# Patient Record
Sex: Female | Born: 1985 | Race: Black or African American | Hispanic: No | Marital: Single | Smoking: Former smoker
Health system: Southern US, Community
[De-identification: ages and names within clinical notes are randomized; demographics above are authoritative.]

## PROBLEM LIST (undated history)

## (undated) ENCOUNTER — Inpatient Hospital Stay (HOSPITAL_COMMUNITY): Payer: Self-pay

## (undated) ENCOUNTER — Emergency Department (HOSPITAL_COMMUNITY): Payer: Medicaid Other

## (undated) DIAGNOSIS — Z789 Other specified health status: Secondary | ICD-10-CM

## (undated) HISTORY — PX: NO PAST SURGERIES: SHX2092

## (undated) HISTORY — DX: Other specified health status: Z78.9

---
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
## 2003-02-18 ENCOUNTER — Emergency Department (HOSPITAL_COMMUNITY): Admission: EM | Admit: 2003-02-18 | Discharge: 2003-02-18 | Payer: Self-pay | Admitting: *Deleted

## 2003-02-18 ENCOUNTER — Encounter: Payer: Self-pay | Admitting: *Deleted

## 2006-08-22 ENCOUNTER — Emergency Department (HOSPITAL_COMMUNITY): Admission: EM | Admit: 2006-08-22 | Discharge: 2006-08-22 | Payer: Self-pay | Admitting: Emergency Medicine

## 2007-02-09 ENCOUNTER — Emergency Department (HOSPITAL_COMMUNITY): Admission: EM | Admit: 2007-02-09 | Discharge: 2007-02-09 | Payer: Self-pay | Admitting: Emergency Medicine

## 2007-07-20 ENCOUNTER — Emergency Department (HOSPITAL_COMMUNITY): Admission: EM | Admit: 2007-07-20 | Discharge: 2007-07-20 | Payer: Self-pay | Admitting: Emergency Medicine

## 2010-01-22 ENCOUNTER — Emergency Department (HOSPITAL_COMMUNITY): Admission: EM | Admit: 2010-01-22 | Discharge: 2010-01-23 | Payer: Self-pay | Admitting: Emergency Medicine

## 2010-12-25 LAB — POCT I-STAT, CHEM 8
Chloride: 102 mEq/L (ref 96–112)
Glucose, Bld: 94 mg/dL (ref 70–99)
HCT: 44 % (ref 36.0–46.0)
Potassium: 3.8 mEq/L (ref 3.5–5.1)

## 2010-12-25 LAB — POCT PREGNANCY, URINE

## 2010-12-25 LAB — RAPID STREP SCREEN (MED CTR MEBANE ONLY): Streptococcus, Group A Screen (Direct): NEGATIVE

## 2013-10-07 NOTE — L&D Delivery Note (Signed)
Patient is 28 y.o. [id number]  admitted in active labor dilated to 10 with, GBS+ inadequate ppx 2/2 precipitous delivery, hx of THC+ on UDS current pregnancy   Delivery Note At 12:53 AM a viable female was delivered via Vaginal, Spontaneous Delivery (Presentation: ROA  ).  APGAR: 9, 9; weight  pending Placenta status: Intact, Spontaneous.  Cord: 3 vessels with the following complications: none  Anesthesia: None  Episiotomy: None Lacerations: 1st degree, a deep sulcal, right labial Suture Repair: 3.0 vicryl rapide Est. Blood Loss (mL): 250  Mom to postpartum.  Baby to Couplet care / Skin to Skin.  Ramah Langhans ROCIO 09/03/2014, 1:38 AM

## 2014-01-05 ENCOUNTER — Encounter (HOSPITAL_COMMUNITY): Payer: Self-pay

## 2014-01-05 ENCOUNTER — Inpatient Hospital Stay (HOSPITAL_COMMUNITY)
Admission: AD | Admit: 2014-01-05 | Discharge: 2014-01-05 | Disposition: A | Discharge status: Home / Self Care | Payer: Medicaid Other | Source: Ambulatory Visit | Attending: Obstetrics & Gynecology | Admitting: Obstetrics & Gynecology

## 2014-01-05 ENCOUNTER — Inpatient Hospital Stay (HOSPITAL_COMMUNITY): Payer: Medicaid Other

## 2014-01-05 DIAGNOSIS — N898 Other specified noninflammatory disorders of vagina: Secondary | ICD-10-CM | POA: Insufficient documentation

## 2014-01-05 DIAGNOSIS — N760 Acute vaginitis: Secondary | ICD-10-CM | POA: Diagnosis not present

## 2014-01-05 DIAGNOSIS — A499 Bacterial infection, unspecified: Secondary | ICD-10-CM | POA: Diagnosis not present

## 2014-01-05 DIAGNOSIS — F17200 Nicotine dependence, unspecified, uncomplicated: Secondary | ICD-10-CM | POA: Insufficient documentation

## 2014-01-05 DIAGNOSIS — O200 Threatened abortion: Secondary | ICD-10-CM

## 2014-01-05 DIAGNOSIS — R110 Nausea: Secondary | ICD-10-CM | POA: Insufficient documentation

## 2014-01-05 DIAGNOSIS — Z3201 Encounter for pregnancy test, result positive: Secondary | ICD-10-CM | POA: Insufficient documentation

## 2014-01-05 DIAGNOSIS — B9689 Other specified bacterial agents as the cause of diseases classified elsewhere: Secondary | ICD-10-CM | POA: Insufficient documentation

## 2014-01-05 LAB — CBC
HEMATOCRIT: 37.6 % (ref 36.0–46.0)
HEMOGLOBIN: 13.4 g/dL (ref 12.0–15.0)
MCH: 35.2 pg — ABNORMAL HIGH (ref 26.0–34.0)
MCHC: 35.6 g/dL (ref 30.0–36.0)
MCV: 98.7 fL (ref 78.0–100.0)
Platelets: 341 10*3/uL (ref 150–400)
RBC: 3.81 MIL/uL — ABNORMAL LOW (ref 3.87–5.11)
RDW: 12.3 % (ref 11.5–15.5)
WBC: 7.9 10*3/uL (ref 4.0–10.5)

## 2014-01-05 LAB — ABO/RH: ABO/RH(D): O POS

## 2014-01-05 LAB — WET PREP, GENITAL
Trich, Wet Prep: NONE SEEN
WBC, Wet Prep HPF POC: NONE SEEN
Yeast Wet Prep HPF POC: NONE SEEN

## 2014-01-05 LAB — URINALYSIS, ROUTINE W REFLEX MICROSCOPIC
BILIRUBIN URINE: NEGATIVE
GLUCOSE, UA: NEGATIVE mg/dL
KETONES UR: NEGATIVE mg/dL
Leukocytes, UA: NEGATIVE
Nitrite: NEGATIVE
PH: 5.5 (ref 5.0–8.0)
PROTEIN: NEGATIVE mg/dL
Specific Gravity, Urine: 1.02 (ref 1.005–1.030)
Urobilinogen, UA: 0.2 mg/dL (ref 0.0–1.0)

## 2014-01-05 LAB — URINE MICROSCOPIC-ADD ON

## 2014-01-05 LAB — POCT PREGNANCY, URINE: Preg Test, Ur: POSITIVE — AB

## 2014-01-05 LAB — HCG, QUANTITATIVE, PREGNANCY: hCG, Beta Chain, Quant, S: 18400 m[IU]/mL — ABNORMAL HIGH (ref <5)

## 2014-01-05 MED ORDER — PROMETHAZINE HCL 25 MG PO TABS: 25.0000 mg | ORAL_TABLET | Freq: Four times a day (QID) | ORAL | Status: DC | PRN

## 2014-01-05 MED ORDER — METRONIDAZOLE 500 MG PO TABS: 500.0000 mg | ORAL_TABLET | Freq: Two times a day (BID) | ORAL | Status: DC

## 2014-01-05 NOTE — Discharge Instructions (Signed)
Threatened Miscarriage  Bleeding during the first 20 weeks of pregnancy is common. This is sometimes called a threatened miscarriage. This is a pregnancy that is threatening to end before the twentieth week of pregnancy. Often this bleeding stops with bed rest or decreased activities as suggested by your caregiver and the pregnancy continues without any more problems. You may be asked to not have sexual intercourse, have orgasms or use tampons until further notice. Sometimes a threatened miscarriage can progress to a complete or incomplete miscarriage. This may or may not require further treatment. Some miscarriages occur before a woman misses a menstrual period and knows she is pregnant.  Miscarriages occur in 15 to 20% of all pregnancies and usually occur during the first 13 weeks of the pregnancy. The exact cause of a miscarriage is usually never known. A miscarriage is natures way of ending a pregnancy that is abnormal or would not make it to term. There are some things that may put you at risk to have a miscarriage, such as:  · Hormone problems.  · Infection of the uterus or cervix.  · Chronic illness, diabetes for example, especially if it is not controlled.  · Abnormal shaped uterus.  · Fibroids in the uterus.  · Incompetent cervix (the cervix is too weak to hold the baby).  · Smoking.  · Drinking too much alcohol. It's best not to drink any alcohol when you are pregnant.  · Taking illegal drugs.  TREATMENT   When a miscarriage becomes complete and all products of conception (all the tissue in the uterus) have been passed, often no treatment is needed. If you think you passed tissue, save it in a container and take it to your doctor for evaluation. If the miscarriage is incomplete (parts of the fetus or placenta remain in the uterus), further treatment may be needed. The most common reason for further treatment is continued bleeding (hemorrhage) because pregnancy tissue did not pass out of the uterus. This  often occurs if a miscarriage is incomplete. Tissue left behind may also become infected. Treatment usually is dilatation and curettage (the removal of the remaining products of pregnancy. This can be done by a simple sucking procedure (suction curettage) or a simple scraping of the inside of the uterus. This may be done in the hospital or in the caregiver's office. This is only done when your caregiver knows that there is no chance for the pregnancy to proceed to term. This is determined by physical examination, negative pregnancy test, falling pregnancy hormone count and/or, an ultrasound revealing a dead fetus.  Miscarriages are often a very emotional time for prospective mothers and fathers. This is not you or your partners fault. It did not occur because of an inadequacy in you or your partner. Nearly all miscarriages occur because the pregnancy has started off wrongly. At least half of these pregnancies have a chromosomal abnormality. It is almost always not inherited. Others may have developmental problems with the fetus or placenta. This does not always show up even when the products miscarried are studied under the microscope. The miscarriage is nearly always not your fault and it is not likely that you could have prevented it from happening. If you are having emotional and grieving problems, talk to your health care provider and even seek counseling, if necessary, before getting pregnant again. You can begin trying for another pregnancy as soon as your caregiver says it is OK.  HOME CARE INSTRUCTIONS   · Your caregiver may order   bed rest depending on how much bleeding and cramping you are having. You may be limited to only getting up to go to the bathroom. You may be allowed to continue light activity. You may need to make arrangements for the care of your other children and for any other responsibilities.  · Keep track of the number of pads you use each day, how often you have to change pads and how  saturated (soaked) they are. Record this information.  · DO NOT USE TAMPONS. Do not douche, have sexual intercourse or orgasms until approved by your caregiver.  · You may receive a follow up appointment for re-evaluation of your pregnancy and a repeat blood test. Re-evaluation often occurs after 2 days and again in 4 to 6 weeks. It is very important that you follow-up in the recommended time period.  · If you are Rh negative and the father is Rh positive or you do not know the fathers' blood type, you may receive a shot (Rh immune globulin) to help prevent abnormal antibodies that can develop and affect the baby in any future pregnancies.  SEEK IMMEDIATE MEDICAL CARE IF:  · You have severe cramps in your stomach, back, or abdomen.  · You have a sudden onset of severe pain in the lower part of your abdomen.  · You develop chills.  · You run an unexplained temperature of 101° F (38.3° C) or higher.  · You pass large clots or tissue. Save any tissue for your caregiver to inspect.  · Your bleeding increases or you become light-headed, weak, or have fainting episodes.  · You have a gush of fluid from your vagina.  · You pass out. This could mean you have a tubal (ectopic) pregnancy.  Document Released: 09/23/2005 Document Revised: 12/16/2011 Document Reviewed: 05/09/2008  Exit[employer]® Patient Information ©2014 [employer].

## 2014-01-05 NOTE — MAU Provider Note (Signed)
Attestation of Attending Supervision of Advanced Practitioner (CNM/NP): Evaluation and management procedures were performed by the Advanced Practitioner under my supervision and collaboration.  I have reviewed the Advanced Practitioner's note and chart, and I agree with the management and plan.  HARRAWAY-SMITH, Seymour Carmack 3:26 PM

## 2014-01-05 NOTE — MAU Provider Note (Signed)
History     CSN: [id number]  Arrival date and time: 01/05/14 1041   First Provider Initiated Contact with Patient 01/05/14 1139      Chief Complaint  Patient presents with  . Possible Pregnancy  . Vaginal Bleeding   HPI Comments: Kristy Gay 28 y.o. G1P0 presents to MAU with vaginal bleeding in early pregnancy. She denies any pain. By her LMP she is 5 weeks and 4 days pregnant. This was unplanned. She has had no GYN care including pap smear since 2006.   Possible Pregnancy  Vaginal Bleeding      History reviewed. No pertinent past medical history.  History reviewed. No pertinent past surgical history.  History reviewed. No pertinent family history.  History  Substance Use Topics  . Smoking status: Current Every Day Smoker -- 0.50 packs/day    Types: Cigarettes  . Smokeless tobacco: Never Used  . Alcohol Use: No    Allergies: No Known Allergies  Prescriptions prior to admission  Medication Sig Dispense Refill  . Homeopathic Products (ALLERGY MEDICINE PO) Take 1 tablet by mouth daily.        Review of Systems  Constitutional: Negative.   HENT: Negative.   Eyes: Negative.   Respiratory: Negative.   Cardiovascular: Negative.   Gastrointestinal: Negative.   Genitourinary: Positive for vaginal bleeding.       Vaginal bleeding  Musculoskeletal: Negative.   Skin: Negative.   Neurological: Negative.   Psychiatric/Behavioral: Negative.    Physical Exam   Blood pressure 115/73, pulse 82, temperature 98.3 F (36.8 C), temperature source Oral, resp. rate 16, height 5' 5.5" (1.664 m), weight 205 lb 6.4 oz (93.169 kg), last menstrual period 11/27/2013, SpO2 99.00%.  Physical Exam  Constitutional: She is oriented to person, place, and time. She appears well-developed and well-nourished. No distress.  HENT:  Head: Normocephalic and atraumatic.  Eyes: Pupils are equal, round, and reactive to light.  GI: Soft. Bowel sounds are normal. She exhibits no distension  and no mass. There is no tenderness. There is no rebound and no guarding.  Genitourinary:  Genital:external negative Vaginal:small amount pink discharge Cervix:closed/ thick Bimanual:nontender   Musculoskeletal: Normal range of motion.  Neurological: She is alert and oriented to person, place, and time.  Skin: Skin is warm and dry.  Psychiatric: She has a normal mood and affect. Her behavior is normal. Judgment and thought content normal.   Results for orders placed during the hospital encounter of 01/05/14 (from the past 24 hour(s))  URINALYSIS, ROUTINE W REFLEX MICROSCOPIC     Status: Abnormal   Collection Time    01/05/14 10:58 AM      Result Value Ref Range   Color, Urine YELLOW  YELLOW   APPearance CLEAR  CLEAR   Specific Gravity, Urine 1.020  1.005 - 1.030   pH 5.5  5.0 - 8.0   Glucose, UA NEGATIVE  NEGATIVE mg/dL   Hgb urine dipstick TRACE (*) NEGATIVE   Bilirubin Urine NEGATIVE  NEGATIVE   Ketones, ur NEGATIVE  NEGATIVE mg/dL   Protein, ur NEGATIVE  NEGATIVE mg/dL   Urobilinogen, UA 0.2  0.0 - 1.0 mg/dL   Nitrite NEGATIVE  NEGATIVE   Leukocytes, UA NEGATIVE  NEGATIVE  URINE MICROSCOPIC-ADD ON     Status: None   Collection Time    01/05/14 10:58 AM      Result Value Ref Range   Squamous Epithelial / LPF RARE  RARE   WBC, UA 0-2  <3 WBC/hpf  POCT  PREGNANCY, URINE     Status: Abnormal   Collection Time    01/05/14 11:01 AM      Result Value Ref Range   Preg Test, Ur POSITIVE (*) NEGATIVE  WET PREP, GENITAL     Status: Abnormal   Collection Time    01/05/14 11:49 AM      Result Value Ref Range   Yeast Wet Prep HPF POC NONE SEEN  NONE SEEN   Trich, Wet Prep NONE SEEN  NONE SEEN   Clue Cells Wet Prep HPF POC FEW (*) NONE SEEN   WBC, Wet Prep HPF POC NONE SEEN  NONE SEEN  CBC     Status: Abnormal   Collection Time    01/05/14 12:15 PM      Result Value Ref Range   WBC 7.9  4.0 - 10.5 K/uL   RBC 3.81 (*) 3.87 - 5.11 MIL/uL   Hemoglobin 13.4  12.0 - 15.0 g/dL    HCT 40.937.6  81.136.0 - 91.446.0 %   MCV 98.7  78.0 - 100.0 fL   MCH 35.2 (*) 26.0 - 34.0 pg   MCHC 35.6  30.0 - 36.0 g/dL   RDW 78.212.3  95.611.5 - 21.315.5 %   Platelets 341  150 - 400 K/uL  HCG, QUANTITATIVE, PREGNANCY     Status: Abnormal   Collection Time    01/05/14 12:15 PM      Result Value Ref Range   hCG, Beta Chain, Quant, S 18400 (*) <5 mIU/mL  ABO/RH     Status: None   Collection Time    01/05/14 12:15 PM      Result Value Ref Range   ABO/RH(D) O POS     [place] Ob Comp Less 14 Wks  01/05/2014   CLINICAL DATA:  Vaginal bleeding.  Pregnant.  EXAM: OBSTETRIC <14 WK US AND TRANSVAGINAL OB US  TECHNIQUE: Both transabdominal and transvaginal ultrasound examinations were performed for complete evaluation of the gestation as well as the maternal uterus, adnexal regions, and pelvic cul-de-sac. Transvaginal technique was performed to assess early pregnancy.  COMPARISON:  None.  FINDINGS: Intrauterine gestational sac: There is a gestational sac in the right upper uterine segment.  Yolk sac:  No discrete yolk sac is seen  Embryo: An amorphous appearing embryo was evident in gestational sac.  Cardiac Activity: Questionable 2 different areas along the amorphous embryo with cardiac activity.  Heart Rate:  158 bpm  CRL:   9.7  mm   7 w 1 d                  US EDC: 08/23/2014  Maternal uterus/adnexae: Right ovary 2.1 cm corpus luteum. Ovary is otherwise unremarkable. No adnexal masses. No free fluid. No uterine masses. No subchorionic hemorrhage.  IMPRESSION: 1. Live intrauterine pregnancy. There is a gestational sac with an amorphous appearing embryo but does show the the heart activity. However, this is not well defined. Recommend repeat ultrasound in 7-10 days to document normal progression. 2. No subchorionic hemorrhage. No adnexal masses or free fluid. Right ovarian corpus luteum.   Electronically Signed   By: Amie Portlandavid  Ormond M.D.   On: 01/05/2014 13:44   [place] Ob Transvaginal  01/05/2014   CLINICAL DATA:  Vaginal bleeding.   Pregnant.  EXAM: OBSTETRIC <14 WK US AND TRANSVAGINAL OB US  TECHNIQUE: Both transabdominal and transvaginal ultrasound examinations were performed for complete evaluation of the gestation as well as the maternal uterus, adnexal regions, and pelvic cul-de-sac. Transvaginal  technique was performed to assess early pregnancy.  COMPARISON:  None.  FINDINGS: Intrauterine gestational sac: There is a gestational sac in the right upper uterine segment.  Yolk sac:  No discrete yolk sac is seen  Embryo: An amorphous appearing embryo was evident in gestational sac.  Cardiac Activity: Questionable 2 different areas along the amorphous embryo with cardiac activity.  Heart Rate:  158 bpm  CRL:   9.7  mm   7 w 1 d                  [place] EDC: 08/23/2014  Maternal uterus/adnexae: Right ovary 2.1 cm corpus luteum. Ovary is otherwise unremarkable. No adnexal masses. No free fluid. No uterine masses. No subchorionic hemorrhage.  IMPRESSION: 1. Live intrauterine pregnancy. There is a gestational sac with an amorphous appearing embryo but does show the the heart activity. However, this is not well defined. Recommend repeat ultrasound in 7-10 days to document normal progression. 2. No subchorionic hemorrhage. No adnexal masses or free fluid. Right ovarian corpus luteum.   Electronically Signed   By: Amie Portland M.D.   On: 01/05/2014 13:44     MAU Course  Procedures  MDM Wet prep, GC, Chlamydia, CBC, UA, U/S, ABORh, Quant Declines anything for pain Spoke with Dr Macon Large  Concerning U/S and she advised repeat U/S in 10 days  Assessment and Plan   A: threatened miscarriage Nausea in pregnancy Bacterial vaginosis  P: Miscarriage precautions Repeat U/S in 10 days/ follow up in MAU after Nothing in vagina till told differently Phenergan 25 mg po q6 hrs Flagyl 500 mg po BID Advised to start PNV daily Plans care at [facility] Needs pregnancy Verification   Carolynn Serve 01/05/2014, 11:56 AM

## 2014-01-05 NOTE — MAU Note (Signed)
Patient states she has had positive home pregnancy test. States she woke up this am with bright red vaginal bleeding with small clots, no pain. Slight nausea, no vomiting.

## 2014-01-06 LAB — GC/CHLAMYDIA PROBE AMP
CT Probe RNA: NEGATIVE
GC PROBE AMP APTIMA: NEGATIVE

## 2014-01-20 ENCOUNTER — Inpatient Hospital Stay (HOSPITAL_COMMUNITY)
Admission: AD | Admit: 2014-01-20 | Discharge: 2014-01-20 | Disposition: A | Discharge status: Home / Self Care | Payer: Medicaid Other | Source: Ambulatory Visit | Attending: Obstetrics & Gynecology | Admitting: Obstetrics & Gynecology

## 2014-01-20 ENCOUNTER — Inpatient Hospital Stay (HOSPITAL_COMMUNITY): Payer: Medicaid Other

## 2014-01-20 ENCOUNTER — Encounter (HOSPITAL_COMMUNITY): Payer: Self-pay | Admitting: Advanced Practice Midwife

## 2014-01-20 DIAGNOSIS — N831 Corpus luteum cyst of ovary, unspecified side: Secondary | ICD-10-CM | POA: Insufficient documentation

## 2014-01-20 DIAGNOSIS — O34599 Maternal care for other abnormalities of gravid uterus, unspecified trimester: Secondary | ICD-10-CM | POA: Insufficient documentation

## 2014-01-20 DIAGNOSIS — O99330 Smoking (tobacco) complicating pregnancy, unspecified trimester: Secondary | ICD-10-CM | POA: Diagnosis not present

## 2014-01-20 DIAGNOSIS — O200 Threatened abortion: Secondary | ICD-10-CM | POA: Diagnosis present

## 2014-01-20 DIAGNOSIS — O209 Hemorrhage in early pregnancy, unspecified: Secondary | ICD-10-CM

## 2014-01-20 NOTE — Discharge Instructions (Signed)
Pelvic Rest Pelvic rest is sometimes recommended for women when:   The placenta is partially or completely covering the opening of the cervix (placenta previa).  There is bleeding between the uterine wall and the amniotic sac in the first trimester (subchorionic hemorrhage).  The cervix begins to open without labor starting (incompetent cervix, cervical insufficiency).  The labor is too early (preterm labor). HOME CARE INSTRUCTIONS  Do not have sexual intercourse, stimulation, or an orgasm.  Do not use tampons, douche, or put anything in the vagina.  Do not lift anything over 10 pounds (4.5 kg).  Avoid strenuous activity or straining your pelvic muscles. SEEK MEDICAL CARE IF:  You have any vaginal bleeding during pregnancy. Treat this as a potential emergency.  You have cramping pain felt low in the stomach (stronger than menstrual cramps).  You notice vaginal discharge (watery, mucus, or bloody).  You have a low, dull backache.  There are regular contractions or uterine tightening. SEEK IMMEDIATE MEDICAL CARE IF: You have vaginal bleeding and have placenta previa.  Document Released: 01/18/2011 Document Revised: 12/16/2011 Document Reviewed: 01/18/2011 [facility] Patient Information 2014 [place], [place]. Pregnancy - First Trimester During sexual intercourse, millions of sperm go into the vagina. Only 1 sperm will penetrate and fertilize the female egg while it is in the Fallopian tube. One week later, the fertilized egg implants into the wall of the uterus. An embryo begins to develop into a baby. At 6 to 8 weeks, the eyes and face are formed and the heartbeat can be seen on ultrasound. At the end of 12 weeks (first trimester), all the baby's organs are formed. Now that you are pregnant, you will want to do everything you can to have a healthy baby. Two of the most important things are to get good prenatal care and follow your caregiver's instructions. Prenatal care is all the  medical care you receive before the baby's birth. It is given to prevent, find, and treat problems during the pregnancy and childbirth. PRENATAL EXAMS  During prenatal visits, your weight, blood pressure, and urine are checked. This is done to make sure you are healthy and progressing normally during the pregnancy.  A pregnant woman should gain 25 to 35 pounds during the pregnancy. However, if you are overweight or underweight, your caregiver will advise you regarding your weight.  Your caregiver will ask and answer questions for you.  Blood work, cervical cultures, other necessary tests, and a Pap test are done during your prenatal exams. These tests are done to check on your health and the probable health of your baby. Tests are strongly recommended and done for HIV with your permission. This is the virus that causes AIDS. These tests are done because medicines can be given to help prevent your baby from being born with this infection should you have been infected without knowing it. Blood work is also used to find out your blood type, previous infections, and follow your blood levels (hemoglobin).  Low hemoglobin (anemia) is common during pregnancy. Iron and vitamins are given to help prevent this. Later in the pregnancy, blood tests for diabetes will be done along with any other tests if any problems develop.  You may need other tests to make sure you and the baby are doing well. CHANGES DURING THE FIRST TRIMESTER  Your body goes through many changes during pregnancy. They vary from person to person. Talk to your caregiver about changes you notice and are concerned about. Changes can include:  Your menstrual period  stops.  The egg and sperm carry the genes that determine what you look like. Genes from you and your partner are forming a baby. The female genes determine whether the baby is a boy or a girl.  Your body increases in girth and you may feel bloated.  Feeling sick to your stomach  (nauseous) and throwing up (vomiting). If the vomiting is uncontrollable, call your caregiver.  Your breasts will begin to enlarge and become tender.  Your nipples may stick out more and become darker.  The need to urinate more. Painful urination may mean you have a bladder infection.  Tiring easily.  Loss of appetite.  Cravings for certain kinds of food.  At first, you may gain or lose a couple of pounds.  You may have changes in your emotions from day to day (excited to be pregnant or concerned something may go wrong with the pregnancy and baby).  You may have more vivid and strange dreams. HOME CARE INSTRUCTIONS   It is very important to avoid all smoking, alcohol and non-prescribed drugs during your pregnancy. These affect the formation and growth of the baby. Avoid chemicals while pregnant to ensure the delivery of a healthy infant.  Start your prenatal visits by the 12th week of pregnancy. They are usually scheduled monthly at first, then more often in the last 2 months before delivery. Keep your caregiver's appointments. Follow your caregiver's instructions regarding medicine use, blood and lab tests, exercise, and diet.  During pregnancy, you are providing food for you and your baby. Eat regular, well-balanced meals. Choose foods such as meat, fish, milk and other low fat dairy products, vegetables, fruits, and whole-grain breads and cereals. Your caregiver will tell you of the ideal weight gain.  You can help morning sickness by keeping soda crackers at the bedside. Eat a couple before arising in the morning. You may want to use the crackers without salt on them.  Eating 4 to 5 small meals rather than 3 large meals a day also may help the nausea and vomiting.  Drinking liquids between meals instead of during meals also seems to help nausea and vomiting.  A physical sexual relationship may be continued throughout pregnancy if there are no other problems. Problems may be early  (premature) leaking of amniotic fluid from the membranes, vaginal bleeding, or belly (abdominal) pain.  Exercise regularly if there are no restrictions. Check with your caregiver or physical therapist if you are unsure of the safety of some of your exercises. Greater weight gain will occur in the last 2 trimesters of pregnancy. Exercising will help:  Control your weight.  Keep you in shape.  Prepare you for labor and delivery.  Help you lose your pregnancy weight after you deliver your baby.  Wear a good support or jogging bra for breast tenderness during pregnancy. This may help if worn during sleep too.  Ask when prenatal classes are available. Begin classes when they are offered.  Do not use hot tubs, steam rooms, or saunas.  Wear your seat belt when driving. This protects you and your baby if you are in an accident.  Avoid raw meat, uncooked cheese, cat litter boxes, and soil used by cats throughout the pregnancy. These carry germs that can cause birth defects in the baby.  The first trimester is a good time to visit your dentist for your dental health. Getting your teeth cleaned is okay. Use a softer toothbrush and brush gently during pregnancy.  Ask for help if  you have financial, counseling, or nutritional needs during pregnancy. Your caregiver will be able to offer counseling for these needs as well as refer you for other special needs.  Do not take any medicines or herbs unless told by your caregiver.  Inform your caregiver if there is any mental or physical domestic violence.  Make a list of emergency phone numbers of family, friends, hospital, and police and fire departments.  Write down your questions. Take them to your prenatal visit.  Do not douche.  Do not cross your legs.  If you have to stand for long periods of time, rotate you feet or take small steps in a circle.  You may have more vaginal secretions that may require a sanitary pad. Do not use tampons or  scented sanitary pads. MEDICINES AND DRUG USE IN PREGNANCY  Take prenatal vitamins as directed. The vitamin should contain 1 milligram of folic acid. Keep all vitamins out of reach of children. Only a couple vitamins or tablets containing iron may be fatal to a baby or young child when ingested.  Avoid use of all medicines, including herbs, over-the-counter medicines, not prescribed or suggested by your caregiver. Only take over-the-counter or prescription medicines for pain, discomfort, or fever as directed by your caregiver. Do not use aspirin, ibuprofen, or naproxen unless directed by your caregiver.  Let your caregiver also know about herbs you may be using.  Alcohol is related to a number of birth defects. This includes fetal alcohol syndrome. All alcohol, in any form, should be avoided completely. Smoking will cause low birth rate and premature babies.  Street or illegal drugs are very harmful to the baby. They are absolutely forbidden. A baby born to an addicted mother will be addicted at birth. The baby will go through the same withdrawal an adult does.  Let your caregiver know about any medicines that you have to take and for what reason you take them. SEEK MEDICAL CARE IF:  You have any concerns or worries during your pregnancy. It is better to call with your questions if you feel they cannot wait, rather than worry about them. SEEK IMMEDIATE MEDICAL CARE IF:   An unexplained oral temperature above 102 F (38.9 C) develops, or as your caregiver suggests.  You have leaking of fluid from the vagina (birth canal). If leaking membranes are suspected, take your temperature and inform your caregiver of this when you call.  There is vaginal spotting or bleeding. Notify your caregiver of the amount and how many pads are used.  You develop a bad smelling vaginal discharge with a change in the color.  You continue to feel sick to your stomach (nauseated) and have no relief from remedies  suggested. You vomit blood or coffee ground-like materials.  You lose more than 2 pounds of weight in 1 week.  You gain more than 2 pounds of weight in 1 week and you notice swelling of your face, hands, feet, or legs.  You gain 5 pounds or more in 1 week (even if you do not have swelling of your hands, face, legs, or feet).  You get exposed to [place] measles and have never had them.  You are exposed to fifth disease or chickenpox.  You develop belly (abdominal) pain. Round ligament discomfort is a common non-cancerous (benign) cause of abdominal pain in pregnancy. Your caregiver still must evaluate this.  You develop headache, fever, diarrhea, pain with urination, or shortness of breath.  You fall or are in a car  accident or have any kind of trauma.  There is mental or physical violence in your home. Document Released: 09/17/2001 Document Revised: 06/17/2012 Document Reviewed: 03/21/2009 [facility] Patient Information 2014 [place], [place].

## 2014-01-20 NOTE — MAU Provider Note (Signed)
History     CSN: [id number]  Arrival date and time: 01/20/14 1330   None     No chief complaint on file.  HPI This is a 28 y.o. female at 4512w5d who presents for followup ultrasound.  She was seen here for threatened AB and told to followup with US this week. Denies bleeding or pain.   Previous MAU visit: A: threatened miscarriage  Nausea in pregnancy  Bacterial vaginosis   P: Miscarriage precautions  Repeat U/S in 10 days/ follow up in MAU after  Nothing in vagina till told differently  Phenergan 25 mg po q6 hrs  Flagyl 500 mg po BID  Advised to start PNV daily  Plans care at [facility]  Needs pregnancy Verification  OB History   Grav Para Term Preterm Abortions TAB SAB Ect Mult Living   3    2  1          No past medical history on file.  No past surgical history on file.  No family history on file.  History  Substance Use Topics  . Smoking status: Current Every Day Smoker -- 0.50 packs/day    Types: Cigarettes  . Smokeless tobacco: Never Used  . Alcohol Use: No    Allergies: No Known Allergies  Prescriptions prior to admission  Medication Sig Dispense Refill  . Homeopathic Products (ALLERGY MEDICINE PO) Take 1 tablet by mouth daily.      . metroNIDAZOLE (FLAGYL) 500 MG tablet Take 1 tablet (500 mg total) by mouth 2 (two) times daily.  14 tablet  0  . promethazine (PHENERGAN) 25 MG tablet Take 1 tablet (25 mg total) by mouth every 6 (six) hours as needed for nausea or vomiting.  30 tablet  0    Review of Systems  Constitutional: Negative for fever and malaise/fatigue.  Gastrointestinal: Negative for nausea, vomiting and abdominal pain.  Neurological: Negative for dizziness.   Physical Exam   Last menstrual period 11/27/2013.  Physical Exam  Constitutional: She is oriented to person, place, and time. She appears well-developed and well-nourished. No distress.  Cardiovascular: Normal rate.   Respiratory: Effort normal.  Genitourinary:  Exam not indicated   Musculoskeletal: Normal range of motion.  Neurological: She is alert and oriented to person, place, and time.  Skin: Skin is warm and dry.  Psychiatric: She has a normal mood and affect.    MAU Course  Procedures  MDM FIRST ULTRASOUND:       [place] Ob Comp Less 14 Wks  01/05/2014   CLINICAL DATA:  Vaginal bleeding.  Pregnant.  EXAM: OBSTETRIC <14 WK US AND TRANSVAGINAL OB US  TECHNIQUE: Both transabdominal and transvaginal ultrasound examinations were performed for complete evaluation of the gestation as well as the maternal uterus, adnexal regions, and pelvic cul-de-sac. Transvaginal technique was performed to assess early pregnancy.  COMPARISON:  None.  FINDINGS: Intrauterine gestational sac: There is a gestational sac in the right upper uterine segment.  Yolk sac:  No discrete yolk sac is seen  Embryo: An amorphous appearing embryo was evident in gestational sac.  Cardiac Activity: Questionable 2 different areas along the amorphous embryo with cardiac activity.  Heart Rate:  158 bpm  CRL:   9.7  mm   7 w 1 d                  US EDC: 08/23/2014  Maternal uterus/adnexae: Right ovary 2.1 cm corpus luteum. Ovary is otherwise unremarkable. No adnexal masses. No free fluid. No uterine  masses. No subchorionic hemorrhage.    IMPRESSION: 1. Live intrauterine pregnancy. There is a gestational sac with an amorphous appearing embryo but does show the the heart activity. However, this is not well defined. Recommend repeat ultrasound in 7-10 days to document normal progression. 2. No subchorionic hemorrhage. No adnexal masses or free fluid. Right ovarian corpus luteum.   Electronically Signed   By: Amie Portlandavid  Ormond M.D.   On: 01/05/2014 13:44   TODAY'S ULTRASOUND:  [place] Ob Transvaginal  01/20/2014   CLINICAL DATA:  Pregnant, follow-up for viability  EXAM: TRANSVAGINAL OB ULTRASOUND  TECHNIQUE: Transvaginal ultrasound was performed for complete evaluation of the gestation as well as the maternal uterus, adnexal  regions, and pelvic cul-de-sac.  COMPARISON:  01/05/2014  FINDINGS: Intrauterine gestational sac: Visualized/normal in shape.  Yolk sac:  Present  Embryo:  Present  Cardiac Activity: Present  Heart Rate: 158 bpm  CRL:   14.1  mm   7 w 5 d  Maternal uterus/adnexae: Suspected chorionic/subchorionic hemorrhage.  Right ovary is within normal limits, noting a corpus luteal cyst.  Left ovary is within normal limits.  Small free fluid.    IMPRESSION: Single live intrauterine gestation, as above.  Estimated gestational age [redacted] weeks 2 days by prior first-trimester ultrasound. However, satisfactory interval growth is not present.  Serial beta HCG/clinical follow-up is suggested, supplemented by repeat sonography in 2 weeks as clinically warranted.     Electronically Signed   By: Charline BillsSriyesh  Krishnan M.D.   On: 01/20/2014 15:01    Assessment and Plan  A:  SIUP at 6556w5d by measurements today       The first US showed an "amorphous embryo" which measured what would be 9+2 wks today.  However, today's embryo looks normal with heartbeat, so I would be inclined to use today's measurments for dating. Will consult with Dr Macon LargeAnyanwu.  P:  I instructed pt to proceed with Prenatal Care appointments.      Come back if develops pain or bleeding  Kristy Gay 01/20/2014, 3:11 PM

## 2014-01-20 NOTE — MAU Note (Signed)
No pain or bleeding

## 2014-01-20 NOTE — MAU Note (Signed)
States is doing well. Plan explained. No complaints offered.

## 2014-01-24 NOTE — MAU Provider Note (Signed)

## 2014-03-08 ENCOUNTER — Encounter: Payer: Self-pay | Admitting: Advanced Practice Midwife

## 2014-04-06 ENCOUNTER — Inpatient Hospital Stay (HOSPITAL_COMMUNITY)
Admission: AD | Admit: 2014-04-06 | Discharge: 2014-04-06 | Disposition: A | Discharge status: Home / Self Care | Payer: Medicaid Other | Source: Ambulatory Visit | Attending: Family Medicine | Admitting: Family Medicine

## 2014-04-06 ENCOUNTER — Encounter (HOSPITAL_COMMUNITY): Payer: Self-pay | Admitting: General Practice

## 2014-04-06 DIAGNOSIS — O99891 Other specified diseases and conditions complicating pregnancy: Secondary | ICD-10-CM | POA: Diagnosis not present

## 2014-04-06 DIAGNOSIS — M549 Dorsalgia, unspecified: Secondary | ICD-10-CM

## 2014-04-06 DIAGNOSIS — O9989 Other specified diseases and conditions complicating pregnancy, childbirth and the puerperium: Secondary | ICD-10-CM

## 2014-04-06 MED ORDER — CYCLOBENZAPRINE HCL 10 MG PO TABS: 10.0000 mg | ORAL_TABLET | Freq: Three times a day (TID) | ORAL | Status: DC | PRN

## 2014-04-06 NOTE — MAU Provider Note (Signed)
  History     CSN: [id number]  Arrival date and time: 04/06/14 1115   None     No chief complaint on file.  HPI THis is a 28 y.o. female at 7860w4d who presents with c/o wanting to check on baby. States does not feel pregnant. Was wanting to have an abortion but never did. Did not show up for her new OB appt in clinic. Now wants the baby. Denies pain or bleeding.   RN Note:   Patient states she has been trying to decide what to do with pregnancy and [facility] States she she does not feel pregnant, denies pain, bleeding, discharge or nausea and vomiting. Patient wants to check fetal well being.        OB History   Grav Para Term Preterm Abortions TAB SAB Ect Mult Living   3    2  1          History reviewed. No pertinent past medical history.  History reviewed. No pertinent past surgical history.  History reviewed. No pertinent family history.  History  Substance Use Topics  . Smoking status: Current Every Day Smoker -- 0.50 packs/day    Types: Cigarettes  . Smokeless tobacco: Never Used  . Alcohol Use: No    Allergies: No Known Allergies  Prescriptions prior to admission  Medication Sig Dispense Refill  . Prenatal Vit-Min-FA-Fish Oil ([facility] PRENATAL GUMMY PO) Take 2 tablets by mouth daily.        Review of Systems  Constitutional: Negative for fever and chills.  Gastrointestinal: Negative for nausea, vomiting and abdominal pain.  Neurological: Negative for dizziness.   Physical Exam   Blood pressure 109/64, pulse 65, temperature 98.4 F (36.9 C), temperature source Oral, resp. rate 16, height 5' 6.5" (1.689 m), weight 99.791 kg (220 lb), last menstrual period 11/27/2013, SpO2 100.00%.  Physical Exam  Constitutional: She is oriented to person, place, and time. She appears well-developed and well-nourished. No distress.  HENT:  Head: Normocephalic.  Cardiovascular: Normal rate.   Respiratory: Effort normal.  GI:  Soft. She exhibits no distension. There is no tenderness. There is no rebound and no guarding.  Musculoskeletal: Normal range of motion.  Neurological: She is alert and oriented to person, place, and time.  Skin: Skin is warm and dry.  Psychiatric: She has a normal mood and affect.    MAU Course  Procedures  MDM No results found for this or any previous visit (from the past 24 hour(s)).   Assessment and Plan  A:  SIUP at 2660w4d       Viable fetus  P;  Reassured baby is growing well.        Will schedule outpatient US        Will try to get a new NOB appt  [facility] CenterWILLIAMS,Noboru Bidinger 04/06/2014, 1:41 PM

## 2014-04-06 NOTE — MAU Note (Signed)
Patient states she has been trying to decide what to do with pregnancy and did [facility]. States she she does not feel pregnant, denies pain, bleeding, discharge or nausea and vomiting. Patient wants to check fetal well being.

## 2014-04-06 NOTE — MAU Provider Note (Signed)
Attestation of Attending Supervision of Advanced Practitioner (PA/CNM/NP): Evaluation and management procedures were performed by the Advanced Practitioner under my supervision and collaboration.  I have reviewed the Advanced Practitioner's note and chart, and I agree with the management and plan.  Alaa Eyerman S, MD [facility] Attending 04/06/2014 6:06 PM   

## 2014-04-06 NOTE — Discharge Instructions (Signed)
Second Trimester of Pregnancy The second trimester is from week 13 through week 28, months 4 through 6. The second trimester is often a time when you feel your best. Your body has also adjusted to being pregnant, and you begin to feel better physically. Usually, morning sickness has lessened or quit completely, you may have more energy, and you may have an increase in appetite. The second trimester is also a time when the fetus is growing rapidly. At the end of the sixth month, the fetus is about 9 inches long and weighs about 1 pounds. You will likely begin to feel the baby move (quickening) between 18 and 20 weeks of the pregnancy. BODY CHANGES Your body goes through many changes during pregnancy. The changes vary from woman to woman.   Your weight will continue to increase. You will notice your lower abdomen bulging out.  You may begin to get stretch marks on your hips, abdomen, and breasts.  You may develop headaches that can be relieved by medicines approved by your health care provider.  You may urinate more often because the fetus is pressing on your bladder.  You may develop or continue to have heartburn as a result of your pregnancy.  You may develop constipation because certain hormones are causing the muscles that push waste through your intestines to slow down.  You may develop hemorrhoids or swollen, bulging veins (varicose veins).  You may have back pain because of the weight gain and pregnancy hormones relaxing your joints between the bones in your pelvis and as a result of a shift in weight and the muscles that support your balance.  Your breasts will continue to grow and be tender.  Your gums may bleed and may be sensitive to brushing and flossing.  Dark spots or blotches (chloasma, mask of pregnancy) may develop on your face. This will likely fade after the baby is born.  A dark line from your belly button to the pubic area (linea nigra) may appear. This will likely fade  after the baby is born.  You may have changes in your hair. These can include thickening of your hair, rapid growth, and changes in texture. Some women also have hair loss during or after pregnancy, or hair that feels dry or thin. Your hair will most likely return to normal after your baby is born. WHAT TO EXPECT AT YOUR PRENATAL VISITS During a routine prenatal visit:  You will be weighed to make sure you and the fetus are growing normally.  Your blood pressure will be taken.  Your abdomen will be measured to track your baby's growth.  The fetal heartbeat will be listened to.  Any test results from the previous visit will be discussed. Your health care provider may ask you:  How you are feeling.  If you are feeling the baby move.  If you have had any abnormal symptoms, such as leaking fluid, bleeding, severe headaches, or abdominal cramping.  If you have any questions. Other tests that may be performed during your second trimester include:  Blood tests that check for:  Low iron levels (anemia).  Gestational diabetes (between 24 and 28 weeks).  Rh antibodies.  Urine tests to check for infections, diabetes, or protein in the urine.  An ultrasound to confirm the proper growth and development of the baby.  An amniocentesis to check for possible genetic problems.  Fetal screens for spina bifida and Down syndrome. HOME CARE INSTRUCTIONS   Avoid all smoking, herbs, alcohol, and unprescribed   drugs. These chemicals affect the formation and growth of the baby.  Follow your health care provider's instructions regarding medicine use. There are medicines that are either safe or unsafe to take during pregnancy.  Exercise only as directed by your health care provider. Experiencing uterine cramps is a good sign to stop exercising.  Continue to eat regular, healthy meals.  Wear a good support bra for breast tenderness.  Do not use hot tubs, steam rooms, or saunas.  Wear your  seat belt at all times when driving.  Avoid raw meat, uncooked cheese, cat litter boxes, and soil used by cats. These carry germs that can cause birth defects in the baby.  Take your prenatal vitamins.  Try taking a stool softener (if your health care provider approves) if you develop constipation. Eat more high-fiber foods, such as fresh vegetables or fruit and whole grains. Drink plenty of fluids to keep your urine clear or pale yellow.  Take warm sitz baths to soothe any pain or discomfort caused by hemorrhoids. Use hemorrhoid cream if your health care provider approves.  If you develop varicose veins, wear support hose. Elevate your feet for 15 minutes, 3-4 times a day. Limit salt in your diet.  Avoid heavy lifting, wear low heel shoes, and practice good posture.  Rest with your legs elevated if you have leg cramps or low back pain.  Visit your dentist if you have not gone yet during your pregnancy. Use a soft toothbrush to brush your teeth and be gentle when you floss.  A sexual relationship may be continued unless your health care provider directs you otherwise.  Continue to go to all your prenatal visits as directed by your health care provider. SEEK MEDICAL CARE IF:   You have dizziness.  You have mild pelvic cramps, pelvic pressure, or nagging pain in the abdominal area.  You have persistent nausea, vomiting, or diarrhea.  You have a bad smelling vaginal discharge.  You have pain with urination. SEEK IMMEDIATE MEDICAL CARE IF:   You have a fever.  You are leaking fluid from your vagina.  You have spotting or bleeding from your vagina.  You have severe abdominal cramping or pain.  You have rapid weight gain or loss.  You have shortness of breath with chest pain.  You notice sudden or extreme swelling of your face, hands, ankles, feet, or legs.  You have not felt your baby move in over an hour.  You have severe headaches that do not go away with  medicine.  You have vision changes. Document Released: 09/17/2001 Document Revised: 09/28/2013 Document Reviewed: 11/24/2012 Exit[employer] Patient Information 2015 [employer]. This information is not intended to replace advice given to you by your health care provider. Make sure you discuss any questions you have with your health care provider.  

## 2014-04-28 ENCOUNTER — Ambulatory Visit (HOSPITAL_COMMUNITY)
Admission: RE | Admit: 2014-04-28 | Discharge: 2014-04-28 | Disposition: A | Discharge status: Home / Self Care | Payer: Medicaid Other | Source: Ambulatory Visit | Attending: Advanced Practice Midwife | Admitting: Advanced Practice Midwife

## 2014-04-28 ENCOUNTER — Encounter: Payer: Self-pay | Admitting: Advanced Practice Midwife

## 2014-04-28 DIAGNOSIS — Z3689 Encounter for other specified antenatal screening: Secondary | ICD-10-CM | POA: Insufficient documentation

## 2014-04-28 DIAGNOSIS — O0930 Supervision of pregnancy with insufficient antenatal care, unspecified trimester: Secondary | ICD-10-CM | POA: Insufficient documentation

## 2014-04-28 DIAGNOSIS — O99891 Other specified diseases and conditions complicating pregnancy: Secondary | ICD-10-CM | POA: Diagnosis not present

## 2014-04-28 DIAGNOSIS — M549 Dorsalgia, unspecified: Secondary | ICD-10-CM | POA: Insufficient documentation

## 2014-04-28 DIAGNOSIS — O0932 Supervision of pregnancy with insufficient antenatal care, second trimester: Secondary | ICD-10-CM | POA: Insufficient documentation

## 2014-04-28 DIAGNOSIS — O9989 Other specified diseases and conditions complicating pregnancy, childbirth and the puerperium: Principal | ICD-10-CM

## 2014-05-03 ENCOUNTER — Encounter: Payer: Self-pay | Admitting: Obstetrics and Gynecology

## 2014-05-03 ENCOUNTER — Ambulatory Visit (INDEPENDENT_AMBULATORY_CARE_PROVIDER_SITE_OTHER): Payer: Medicaid Other | Admitting: Obstetrics and Gynecology

## 2014-05-03 ENCOUNTER — Other Ambulatory Visit (HOSPITAL_COMMUNITY)
Admission: RE | Admit: 2014-05-03 | Discharge: 2014-05-03 | Disposition: A | Discharge status: Home / Self Care | Payer: Medicaid Other | Source: Ambulatory Visit | Attending: Obstetrics and Gynecology | Admitting: Obstetrics and Gynecology

## 2014-05-03 VITALS — BP 114/71 | HR 98 | Temp 98.4°F | Wt 230.9 lb

## 2014-05-03 DIAGNOSIS — Z113 Encounter for screening for infections with a predominantly sexual mode of transmission: Secondary | ICD-10-CM | POA: Diagnosis not present

## 2014-05-03 DIAGNOSIS — Z01419 Encounter for gynecological examination (general) (routine) without abnormal findings: Secondary | ICD-10-CM | POA: Insufficient documentation

## 2014-05-03 DIAGNOSIS — O0932 Supervision of pregnancy with insufficient antenatal care, second trimester: Secondary | ICD-10-CM

## 2014-05-03 DIAGNOSIS — R87810 Cervical high risk human papillomavirus (HPV) DNA test positive: Secondary | ICD-10-CM | POA: Diagnosis present

## 2014-05-03 DIAGNOSIS — Z3400 Encounter for supervision of normal first pregnancy, unspecified trimester: Secondary | ICD-10-CM

## 2014-05-03 DIAGNOSIS — Z3402 Encounter for supervision of normal first pregnancy, second trimester: Secondary | ICD-10-CM

## 2014-05-03 DIAGNOSIS — Z1151 Encounter for screening for human papillomavirus (HPV): Secondary | ICD-10-CM | POA: Diagnosis present

## 2014-05-03 DIAGNOSIS — F1210 Cannabis abuse, uncomplicated: Secondary | ICD-10-CM

## 2014-05-03 DIAGNOSIS — O0930 Supervision of pregnancy with insufficient antenatal care, unspecified trimester: Secondary | ICD-10-CM

## 2014-05-03 DIAGNOSIS — E669 Obesity, unspecified: Secondary | ICD-10-CM

## 2014-05-03 LAB — POCT URINALYSIS DIP (DEVICE)
Bilirubin Urine: NEGATIVE
Glucose, UA: NEGATIVE mg/dL
Hgb urine dipstick: NEGATIVE
Ketones, ur: NEGATIVE mg/dL
LEUKOCYTES UA: NEGATIVE
Nitrite: NEGATIVE
PH: 5.5 (ref 5.0–8.0)
PROTEIN: NEGATIVE mg/dL
Urobilinogen, UA: 0.2 mg/dL (ref 0.0–1.0)

## 2014-05-03 NOTE — Progress Notes (Signed)
Reports occasional edema in ankles and feet.  Initial labs, pap and 1hr gtt today at 1607.Kristy Gay.  Discussed appropriate weight gain (11-20lb); pt. Verbalized understanding.  New OB packet given.

## 2014-05-03 NOTE — Addendum Note (Signed)
Addended by: Louanna RawAMPBELL, Ruben Mahler M on: 05/03/2014 03:58 PM   Modules accepted: Orders

## 2014-05-03 NOTE — Progress Notes (Signed)
   Subjective:    Kristy Gay is a [id number] being seen today for her first obstetrical visit. Initially planned termination so late to care, now accepting. Her obstetrical history is significant for EAB, SAB; obesity. Patient does intend to breast feed. Pregnancy history fully reviewed.  Patient reports no complaints. Has nightmares. Thinks she wants tubal.   Filed Vitals:   05/03/14 1503  BP: 114/71  Pulse: 98  Temp: 98.4 F (36.9 C)  Weight: 230 lb 14.4 oz (104.736 kg)    HISTORY: OB History  Gravida Para Term Preterm AB SAB TAB Ectopic Multiple Living  3    2 1         # Outcome Date GA Lbr Len/2nd Weight Sex Delivery Anes PTL Lv  3 CUR           2 ABT 2002          1 SAB 2001             Past Medical History  Diagnosis Date  . Medical history non-contributory    Past Surgical History  Procedure Laterality Date  . No past surgeries     History reviewed. No pertinent family history.   Exam    Uterus:     Pelvic Exam:    Perineum: No Hemorrhoids, Normal Perineum   Vulva: normal, Bartholin's, Urethra, Skene's normal   Vagina:  normal mucosa, normal discharge       Cervix: no bleeding following Pap and nulliparous appearance   Adnexa: not evaluated   Bony Pelvis: average  System: Breast:  normal appearance, no masses or tenderness   Skin: normal coloration and turgor, no rashes    Neurologic: oriented, normal, grossly non-focal   Extremities: normal strength, tone, and muscle mass   HEENT PERRLA and thyroid without masses   Mouth/Teeth mucous membranes moist, pharynx normal without lesions and dental hygiene good   Neck supple and no masses   Cardiovascular: regular rate and rhythm, no murmurs or gallops   Respiratory:  appears well, vitals normal, no respiratory distress, acyanotic, normal RR, ear and throat exam is normal, neck free of mass or lymphadenopathy, chest clear, no wheezing, crepitations, rhonchi, normal symmetric air entry   Abdomen:  soft, non-tender; bowel sounds normal; no masses,  no organomegaly   Urinary: urethral meatus normal      Assessment:    Pregnancy: [id number] Patient Active Problem List   Diagnosis Date Noted  . Marijuana abuse 05/03/2014  . Mild obesity 05/03/2014  . Insufficient prenatal care in second trimester 04/28/2014        Plan:     Initial labs drawn. 1 hr OGTT Prenatal vitamins. Problem list reviewed and updated. Genetic Screening discussed Quad Screen: too late.  Ultrasound discussed; fetal survey: ordered. F/U anatomy  Follow up in 4 weeks. 50% of 30 min visit spent on counseling and coordination of care.     POE,DEIRDRE 05/03/2014

## 2014-05-03 NOTE — Patient Instructions (Signed)
Second Trimester of Pregnancy The second trimester is from week 13 through week 28, months 4 through 6. The second trimester is often a time when you feel your best. Your body has also adjusted to being pregnant, and you begin to feel better physically. Usually, morning sickness has lessened or quit completely, you may have more energy, and you may have an increase in appetite. The second trimester is also a time when the fetus is growing rapidly. At the end of the sixth month, the fetus is about 9 inches long and weighs about 1 pounds. You will likely begin to feel the baby move (quickening) between 18 and 20 weeks of the pregnancy. BODY CHANGES Your body goes through many changes during pregnancy. The changes vary from woman to woman.   Your weight will continue to increase. You will notice your lower abdomen bulging out.  You may begin to get stretch marks on your hips, abdomen, and breasts.  You may develop headaches that can be relieved by medicines approved by your health care provider.  You may urinate more often because the fetus is pressing on your bladder.  You may develop or continue to have heartburn as a result of your pregnancy.  You may develop constipation because certain hormones are causing the muscles that push waste through your intestines to slow down.  You may develop hemorrhoids or swollen, bulging veins (varicose veins).  You may have back pain because of the weight gain and pregnancy hormones relaxing your joints between the bones in your pelvis and as a result of a shift in weight and the muscles that support your balance.  Your breasts will continue to grow and be tender.  Your gums may bleed and may be sensitive to brushing and flossing.  Dark spots or blotches (chloasma, mask of pregnancy) may develop on your face. This will likely fade after the baby is born.  A dark line from your belly button to the pubic area (linea nigra) may appear. This will likely fade  after the baby is born.  You may have changes in your hair. These can include thickening of your hair, rapid growth, and changes in texture. Some women also have hair loss during or after pregnancy, or hair that feels dry or thin. Your hair will most likely return to normal after your baby is born. WHAT TO EXPECT AT YOUR PRENATAL VISITS During a routine prenatal visit:  You will be weighed to make sure you and the fetus are growing normally.  Your blood pressure will be taken.  Your abdomen will be measured to track your baby's growth.  The fetal heartbeat will be listened to.  Any test results from the previous visit will be discussed. Your health care provider may ask you:  How you are feeling.  If you are feeling the baby move.  If you have had any abnormal symptoms, such as leaking fluid, bleeding, severe headaches, or abdominal cramping.  If you have any questions. Other tests that may be performed during your second trimester include:  Blood tests that check for:  Low iron levels (anemia).  Gestational diabetes (between 24 and 28 weeks).  Rh antibodies.  Urine tests to check for infections, diabetes, or protein in the urine.  An ultrasound to confirm the proper growth and development of the baby.  An amniocentesis to check for possible genetic problems.  Fetal screens for spina bifida and Down syndrome. HOME CARE INSTRUCTIONS   Avoid all smoking, herbs, alcohol, and unprescribed   drugs. These chemicals affect the formation and growth of the baby.  Follow your health care provider's instructions regarding medicine use. There are medicines that are either safe or unsafe to take during pregnancy.  Exercise only as directed by your health care provider. Experiencing uterine cramps is a good sign to stop exercising.  Continue to eat regular, healthy meals.  Wear a good support bra for breast tenderness.  Do not use hot tubs, steam rooms, or saunas.  Wear your  seat belt at all times when driving.  Avoid raw meat, uncooked cheese, cat litter boxes, and soil used by cats. These carry germs that can cause birth defects in the baby.  Take your prenatal vitamins.  Try taking a stool softener (if your health care provider approves) if you develop constipation. Eat more high-fiber foods, such as fresh vegetables or fruit and whole grains. Drink plenty of fluids to keep your urine clear or pale yellow.  Take warm sitz baths to soothe any pain or discomfort caused by hemorrhoids. Use hemorrhoid cream if your health care provider approves.  If you develop varicose veins, wear support hose. Elevate your feet for 15 minutes, 3-4 times a day. Limit salt in your diet.  Avoid heavy lifting, wear low heel shoes, and practice good posture.  Rest with your legs elevated if you have leg cramps or low back pain.  Visit your dentist if you have not gone yet during your pregnancy. Use a soft toothbrush to brush your teeth and be gentle when you floss.  A sexual relationship may be continued unless your health care provider directs you otherwise.  Continue to go to all your prenatal visits as directed by your health care provider. SEEK MEDICAL CARE IF:   You have dizziness.  You have mild pelvic cramps, pelvic pressure, or nagging pain in the abdominal area.  You have persistent nausea, vomiting, or diarrhea.  You have a bad smelling vaginal discharge.  You have pain with urination. SEEK IMMEDIATE MEDICAL CARE IF:   You have a fever.  You are leaking fluid from your vagina.  You have spotting or bleeding from your vagina.  You have severe abdominal cramping or pain.  You have rapid weight gain or loss.  You have shortness of breath with chest pain.  You notice sudden or extreme swelling of your face, hands, ankles, feet, or legs.  You have not felt your baby move in over an hour.  You have severe headaches that do not go away with  medicine.  You have vision changes. Document Released: 09/17/2001 Document Revised: 09/28/2013 Document Reviewed: 11/24/2012 ExitCare Patient Information 2015 [employer]. This information is not intended to replace advice given to you by your health care provider. Make sure you discuss any questions you have with your health care provider.  

## 2014-05-04 LAB — OBSTETRIC PANEL
ANTIBODY SCREEN: NEGATIVE
Basophils Absolute: 0 10*3/uL (ref 0.0–0.1)
Basophils Relative: 0 % (ref 0–1)
EOS PCT: 0 % (ref 0–5)
Eosinophils Absolute: 0 10*3/uL (ref 0.0–0.7)
HCT: 35.2 % — ABNORMAL LOW (ref 36.0–46.0)
HEMOGLOBIN: 12.3 g/dL (ref 12.0–15.0)
Hepatitis B Surface Ag: NEGATIVE
LYMPHS PCT: 21 % (ref 12–46)
Lymphs Abs: 2.3 10*3/uL (ref 0.7–4.0)
MCH: 35.2 pg — ABNORMAL HIGH (ref 26.0–34.0)
MCHC: 34.9 g/dL (ref 30.0–36.0)
MCV: 100.9 fL — AB (ref 78.0–100.0)
MONO ABS: 0.9 10*3/uL (ref 0.1–1.0)
MONOS PCT: 8 % (ref 3–12)
NEUTROS ABS: 7.7 10*3/uL (ref 1.7–7.7)
Neutrophils Relative %: 71 % (ref 43–77)
Platelets: 324 10*3/uL (ref 150–400)
RBC: 3.49 MIL/uL — ABNORMAL LOW (ref 3.87–5.11)
RDW: 12.9 % (ref 11.5–15.5)
Rh Type: POSITIVE
Rubella: 3.27 Index — ABNORMAL HIGH (ref <0.90)
WBC: 10.9 10*3/uL — AB (ref 4.0–10.5)

## 2014-05-04 LAB — HIV ANTIBODY (ROUTINE TESTING W REFLEX): HIV: NONREACTIVE

## 2014-05-04 LAB — GLUCOSE TOLERANCE, 1 HOUR (50G) W/O FASTING: Glucose, 1 Hour GTT: 77 mg/dL (ref 70–140)

## 2014-05-05 LAB — HEMOGLOBINOPATHY EVALUATION
Hemoglobin Other: 0 %
Hgb A2 Quant: 2.6 % (ref 2.2–3.2)
Hgb A: 97.4 % (ref 96.8–97.8)
Hgb F Quant: 0 % (ref 0.0–2.0)
Hgb S Quant: 0 %

## 2014-05-05 LAB — CULTURE, OB URINE

## 2014-05-06 LAB — CYTOLOGY - PAP

## 2014-05-06 LAB — CANNABANOIDS (GC/LC/MS), URINE: THC-COOH (GC/LC/MS), ur confirm: NEGATIVE ng/mL (ref <5)

## 2014-05-07 LAB — PRESCRIPTION MONITORING PROFILE (19 PANEL)
Amphetamine/Meth: NEGATIVE ng/mL
BARBITURATE SCREEN, URINE: NEGATIVE ng/mL
Benzodiazepine Screen, Urine: NEGATIVE ng/mL
Buprenorphine, Urine: NEGATIVE ng/mL
CREATININE, URINE: 169.74 mg/dL (ref >20.0)
Carisoprodol, Urine: NEGATIVE ng/mL
Cocaine Metabolites: NEGATIVE ng/mL
Fentanyl, Ur: NEGATIVE ng/mL
MDMA URINE: NEGATIVE ng/mL
Meperidine, Ur: NEGATIVE ng/mL
Methadone Screen, Urine: NEGATIVE ng/mL
Methaqualone: NEGATIVE ng/mL
NITRITES URINE, INITIAL: NEGATIVE ug/mL
OPIATE SCREEN, URINE: NEGATIVE ng/mL
Oxycodone Screen, Ur: NEGATIVE ng/mL
PH URINE, INITIAL: 5.8 pH (ref 4.5–8.9)
PROPOXYPHENE: NEGATIVE ng/mL
Phencyclidine, Ur: NEGATIVE ng/mL
TRAMADOL UR: NEGATIVE ng/mL
Tapentadol, urine: NEGATIVE ng/mL
Zolpidem, Urine: NEGATIVE ng/mL

## 2014-05-09 IMAGING — US US OB TRANSVAGINAL
1 series · 13 of 28 positions shown · non-contrast
Comparison: None.

CLINICAL DATA: Vaginal bleeding.  Pregnant.

EXAM:
OBSTETRIC <14 WK US AND TRANSVAGINAL OB US
TECHNIQUE: Both transabdominal and transvaginal ultrasound examinations were
performed for complete evaluation of the gestation as well as the
maternal uterus, adnexal regions, and pelvic cul-de-sac.
Transvaginal technique was performed to assess early pregnancy.

[Series 1: us ob comp less 14 wks · 73 acquisitions, 13 frames shown]
[im 3/73]
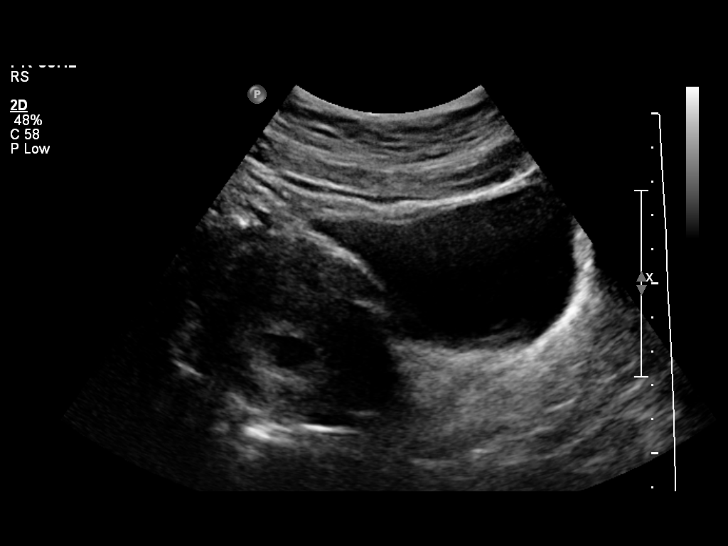
[im 9/73]
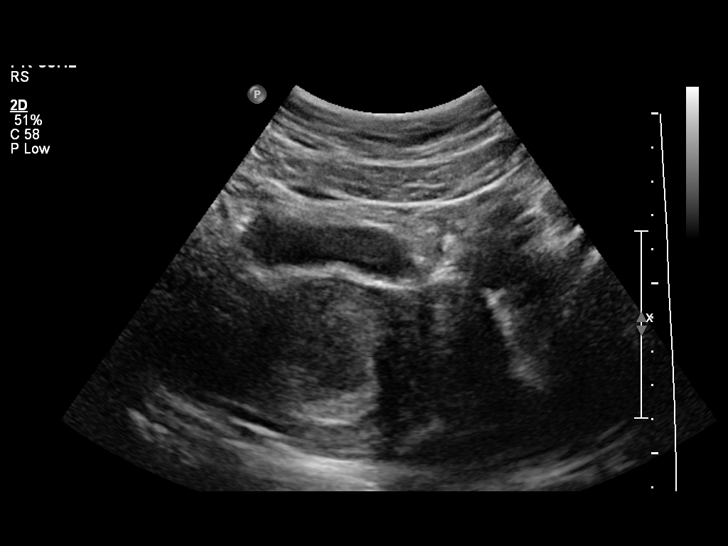
[im 14/73]
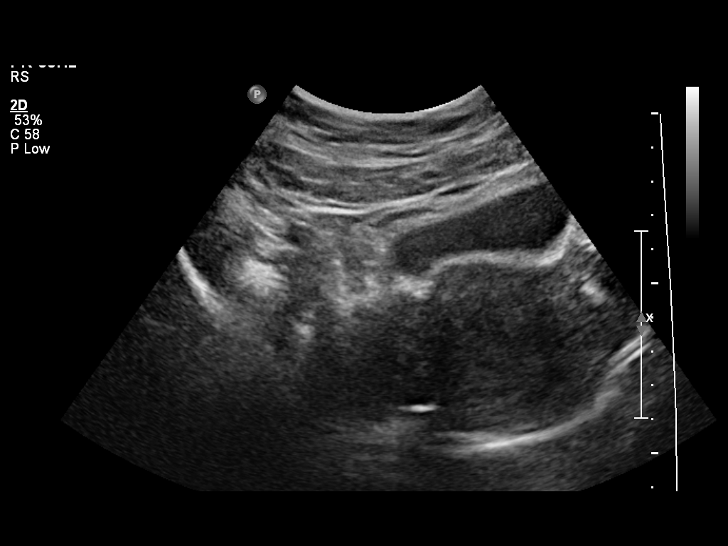
[im 19/73]
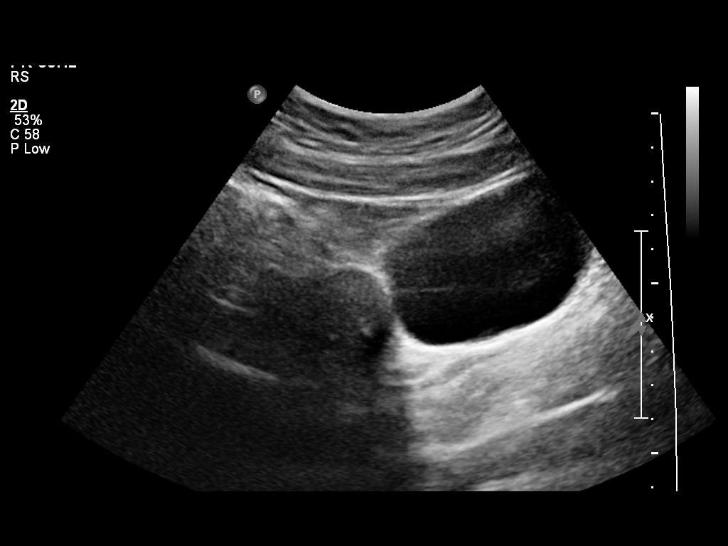
[im 25/73]
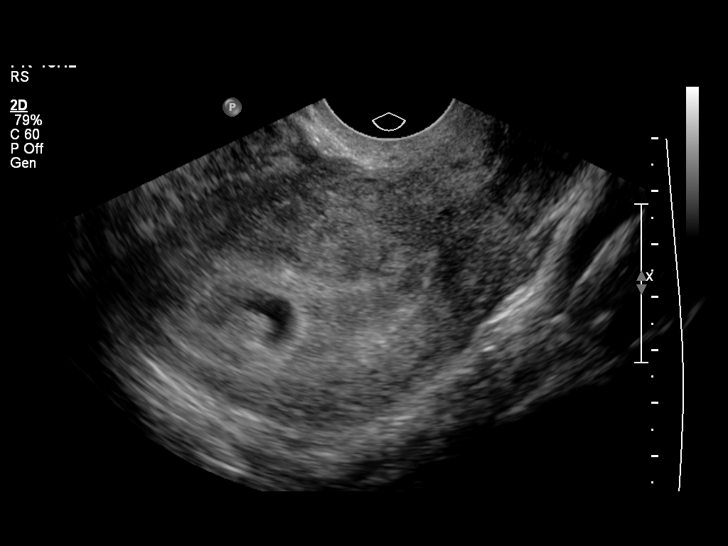
[im 30/73]
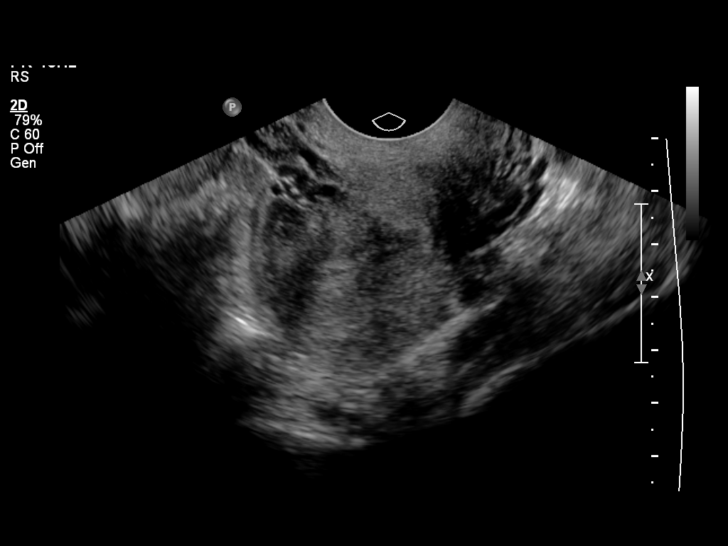
[im 38/73]
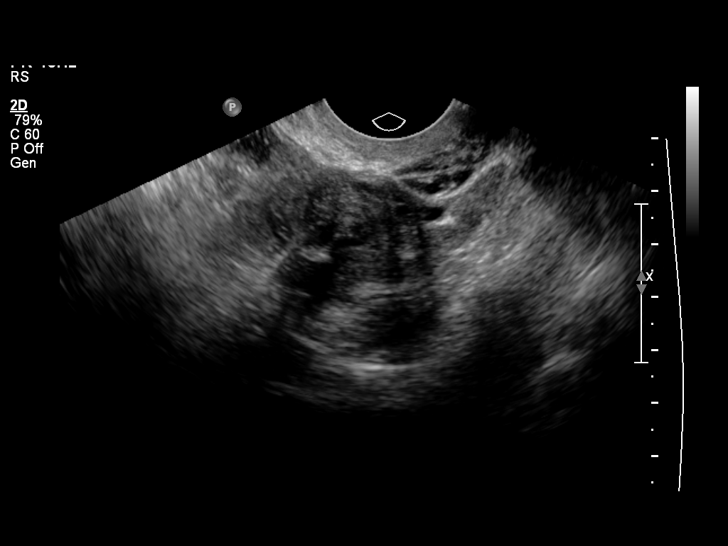
[im 43/73]
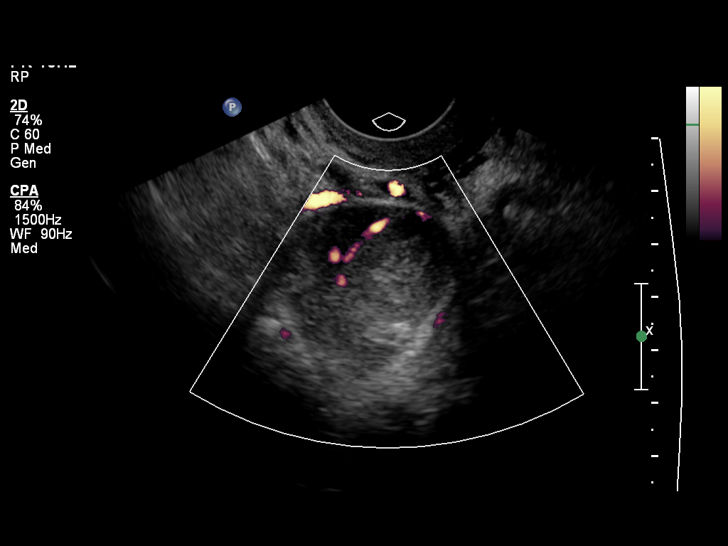
[im 49/73]
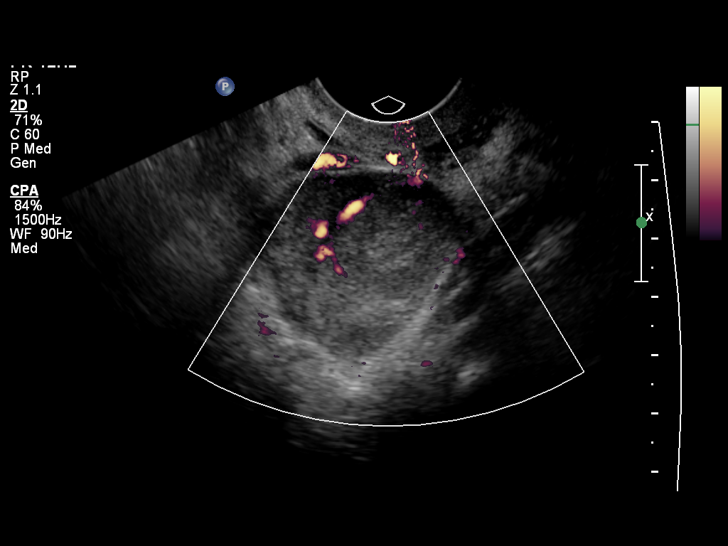
[im 54/73]
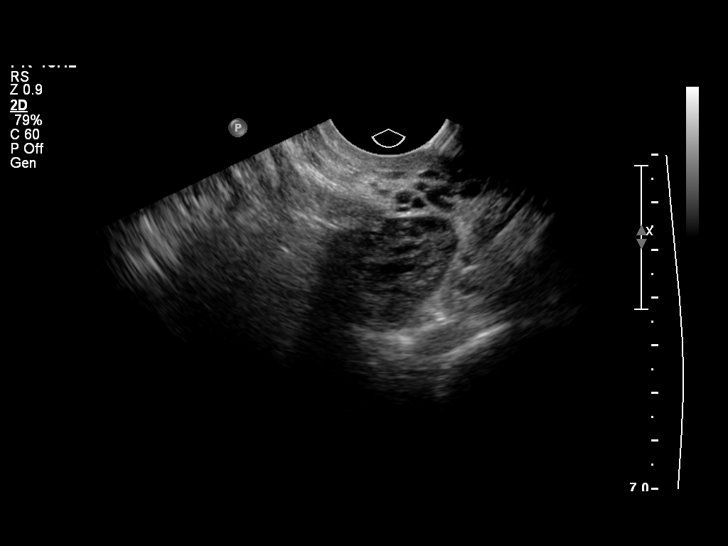
[im 59/73]
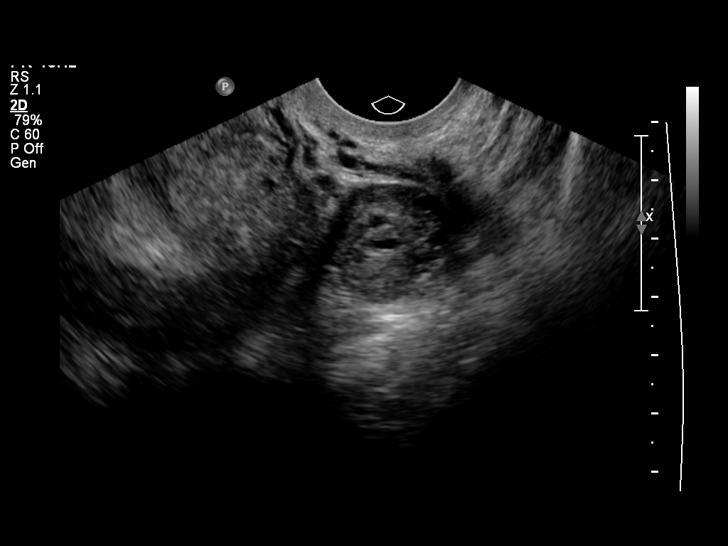
[im 65/73]
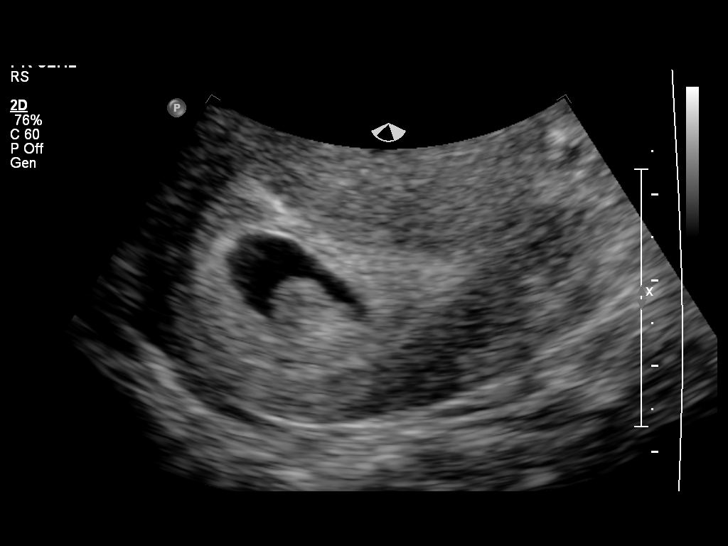
[im 70/73]
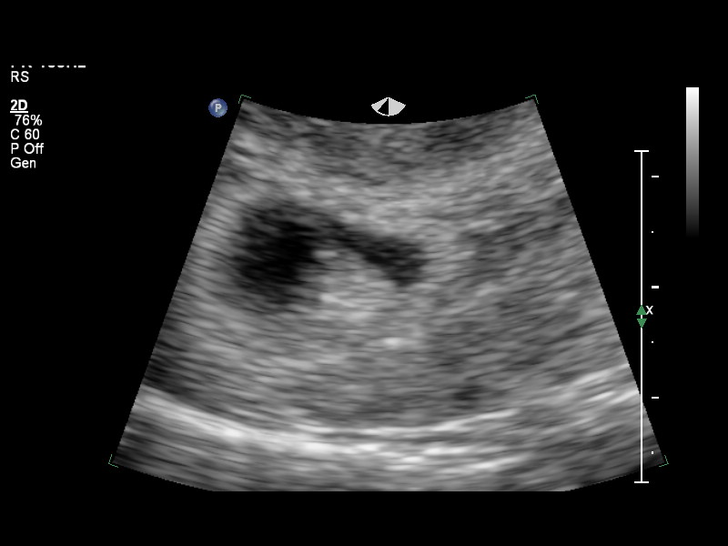

[13 of 28 positions shown; findings below may reference images not displayed]

FINDINGS: Intrauterine gestational sac: There is a gestational sac in the
right upper uterine segment.

Yolk sac:  No discrete yolk sac is seen

Embryo: An amorphous appearing embryo was evident in gestational
sac.

Cardiac Activity: Questionable 2 different areas along the amorphous
embryo with cardiac activity.

Heart Rate:  158 bpm

CRL:   9.7  mm   7 w 1 d                  US EDC: 08/23/2014

Maternal uterus/adnexae: Right ovary 2.1 cm corpus luteum. Ovary is
otherwise unremarkable. No adnexal masses. No free fluid. No uterine
masses. No subchorionic hemorrhage.
IMPRESSION: 1. Live intrauterine pregnancy. There is a gestational sac with an
amorphous appearing embryo but does show the the heart activity.
However, this is not well defined. Recommend repeat ultrasound in
7-10 days to document normal progression.
2. No subchorionic hemorrhage. No adnexal masses or free fluid.
Right ovarian corpus luteum.

## 2014-05-24 IMAGING — US US OB TRANSVAGINAL
1 series · 14 of 28 positions shown · non-contrast
Comparison: 01/05/2014

CLINICAL DATA: Pregnant, follow-up for viability

EXAM:
TRANSVAGINAL OB ULTRASOUND
TECHNIQUE: Transvaginal ultrasound was performed for complete evaluation of the
gestation as well as the maternal uterus, adnexal regions, and
pelvic cul-de-sac.

[Series 1: us ob transvaginal · 14 of 47 slices shown]
[im 2/47]
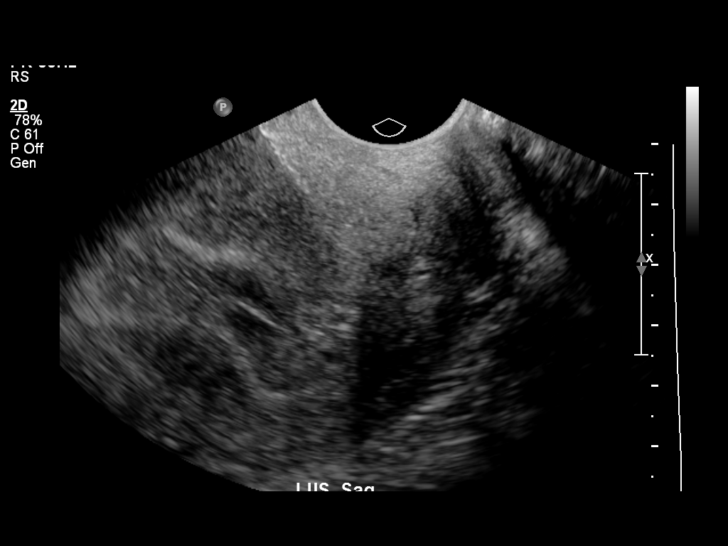
[im 6/47]
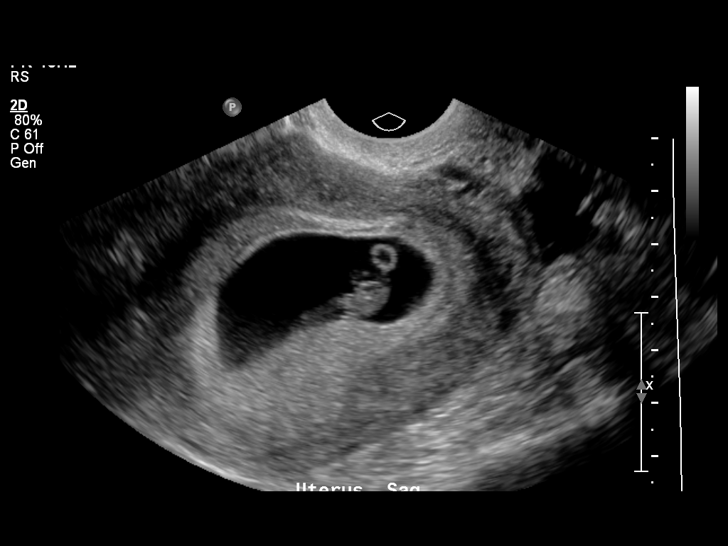
[im 9/47]
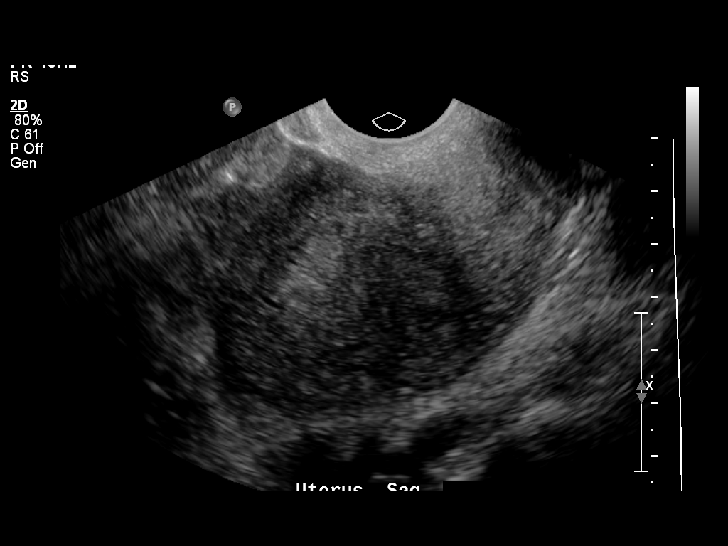
[im 12/47]
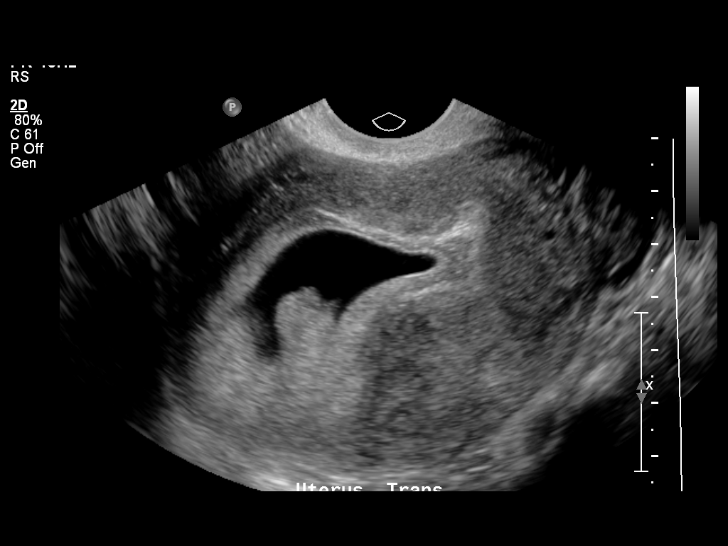
[im 16/47]
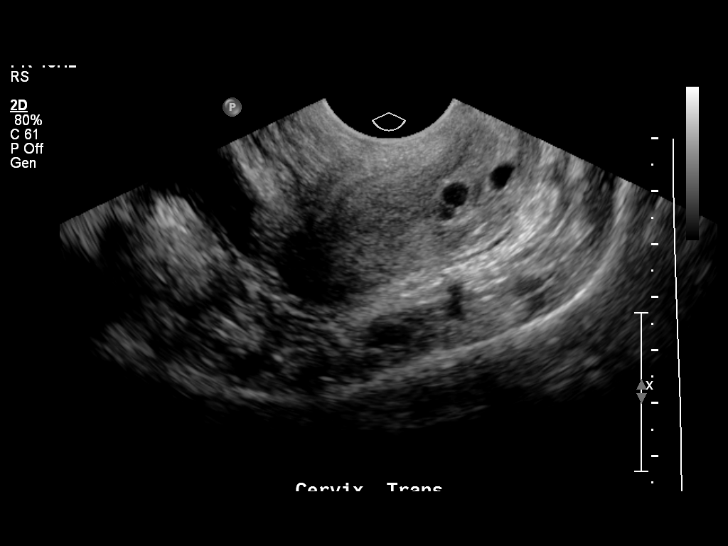
[im 19/47]
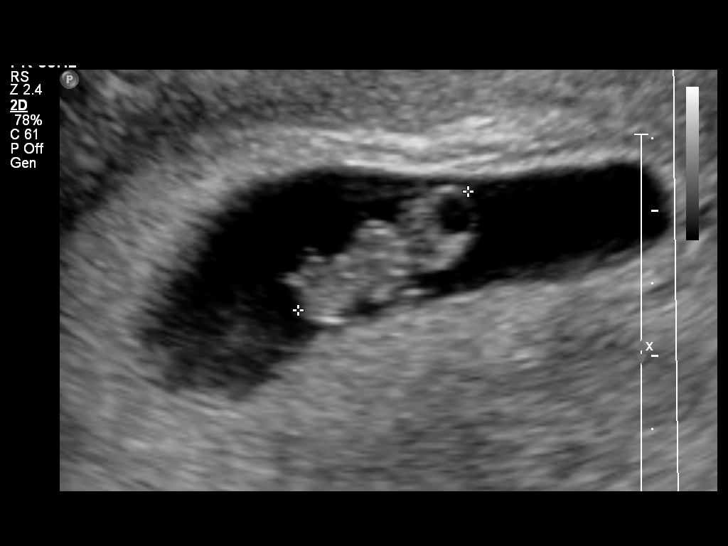
[im 23/47]
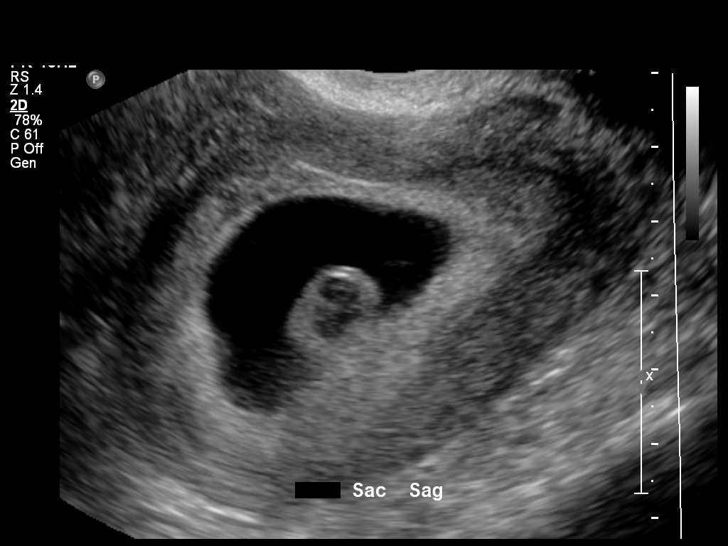
[im 26/47]
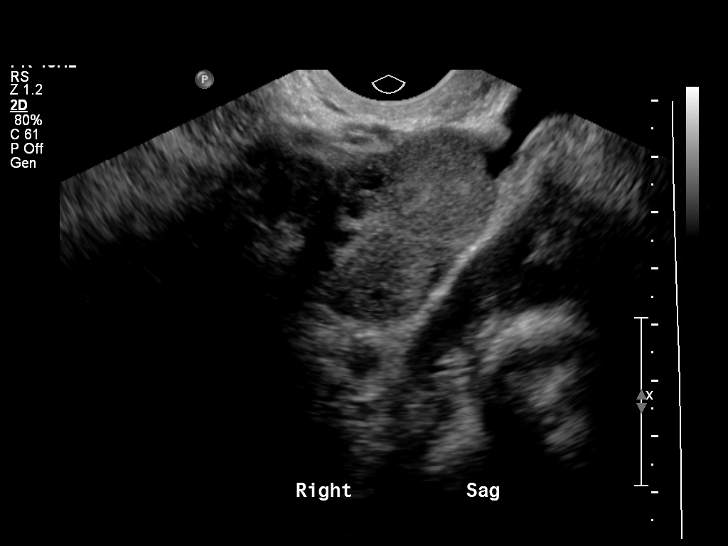
[im 29/47]
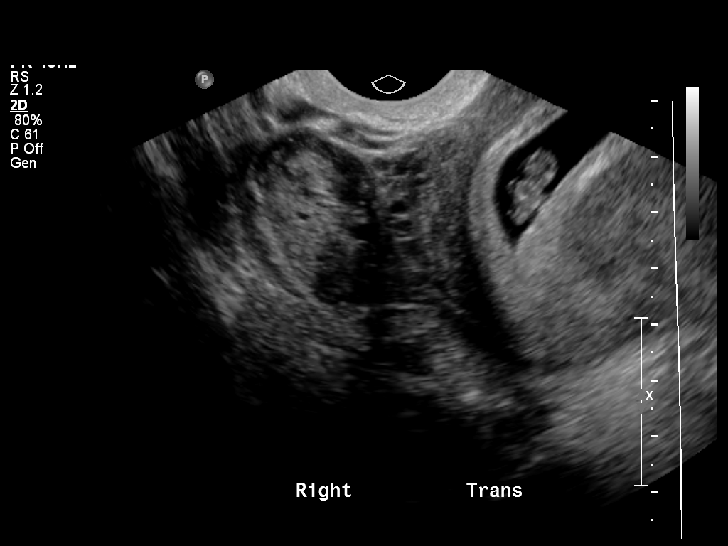
[im 33/47]
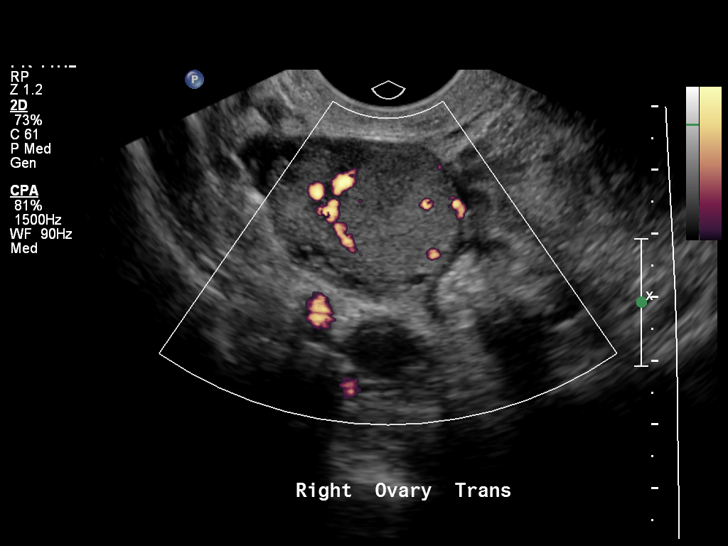
[im 36/47]
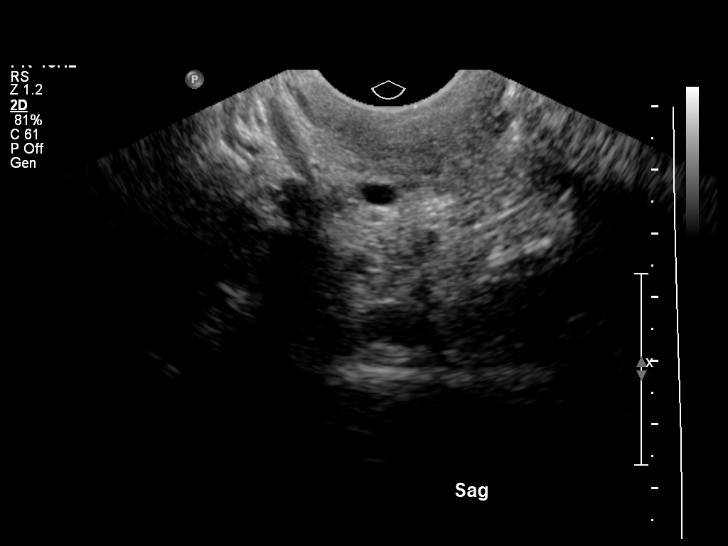
[im 40/47]
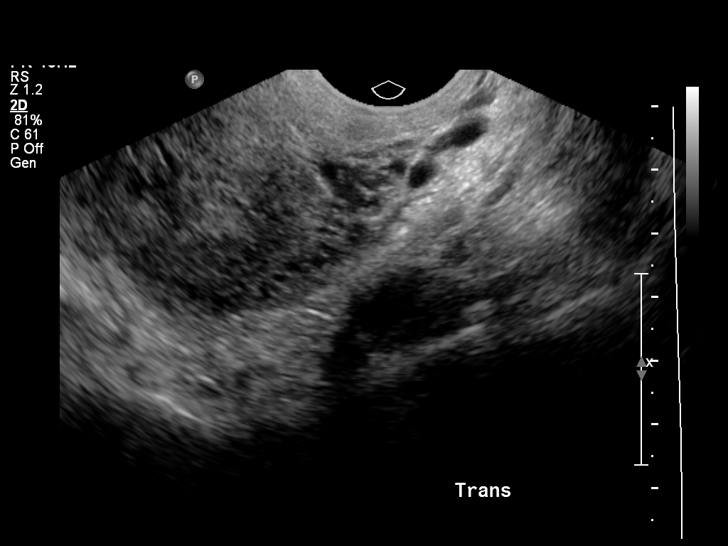
[im 43/47]
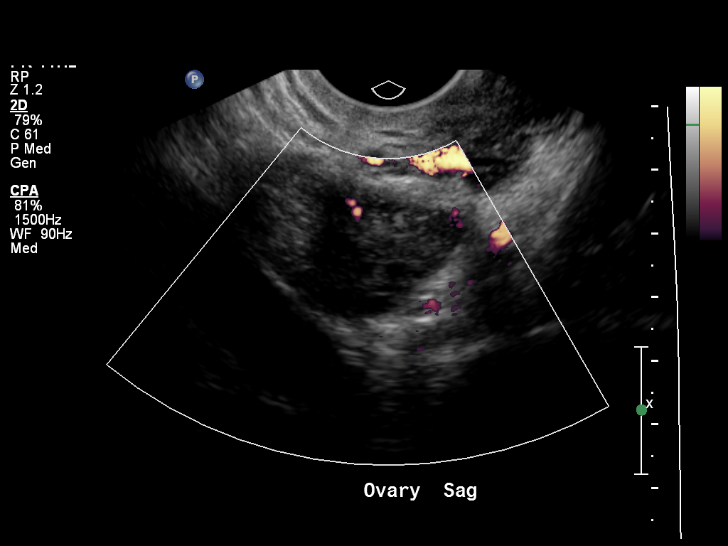
[im 47/47]
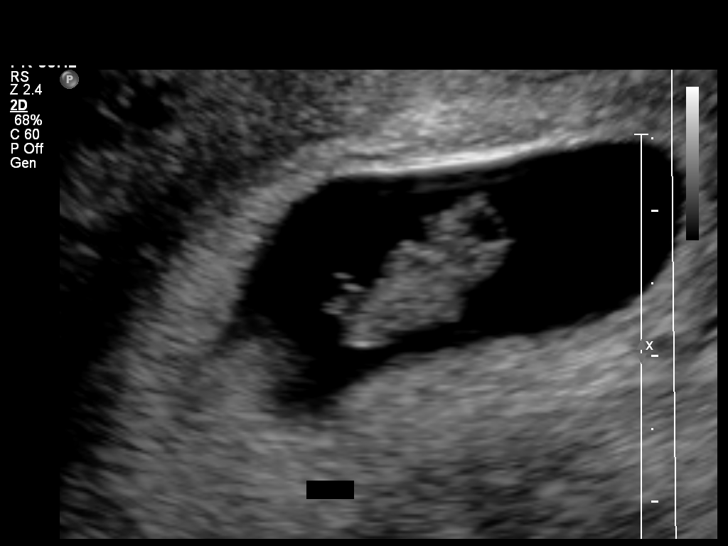

[14 of 28 positions shown; findings below may reference images not displayed]

FINDINGS: Intrauterine gestational sac: Visualized/normal in shape.

Yolk sac:  Present

Embryo:  Present

Cardiac Activity: Present

Heart Rate: 158 bpm

CRL:   14.1  mm   7 w 5 d

Maternal uterus/adnexae: Suspected chorionic/subchorionic
hemorrhage.

Right ovary is within normal limits, noting a corpus luteal cyst.

Left ovary is within normal limits.

Small free fluid.
IMPRESSION: Single live intrauterine gestation, as above.

Estimated gestational age 9 weeks 2 days by prior first-trimester
ultrasound. However, satisfactory interval growth is not present.

Serial beta HCG/clinical follow-up is suggested, supplemented by
repeat sonography in 2 weeks as clinically warranted.

## 2014-05-31 ENCOUNTER — Ambulatory Visit (INDEPENDENT_AMBULATORY_CARE_PROVIDER_SITE_OTHER): Payer: Medicaid Other | Admitting: Family

## 2014-05-31 VITALS — BP 109/69 | HR 108 | Temp 97.3°F | Wt 240.6 lb

## 2014-05-31 DIAGNOSIS — O0930 Supervision of pregnancy with insufficient antenatal care, unspecified trimester: Secondary | ICD-10-CM

## 2014-05-31 DIAGNOSIS — Z3492 Encounter for supervision of normal pregnancy, unspecified, second trimester: Secondary | ICD-10-CM

## 2014-05-31 DIAGNOSIS — O0932 Supervision of pregnancy with insufficient antenatal care, second trimester: Secondary | ICD-10-CM

## 2014-05-31 DIAGNOSIS — Z3480 Encounter for supervision of other normal pregnancy, unspecified trimester: Secondary | ICD-10-CM

## 2014-05-31 LAB — POCT URINALYSIS DIP (DEVICE)
BILIRUBIN URINE: NEGATIVE
Glucose, UA: NEGATIVE mg/dL
HGB URINE DIPSTICK: NEGATIVE
Leukocytes, UA: NEGATIVE
Nitrite: NEGATIVE
PH: 5.5 (ref 5.0–8.0)
Protein, ur: NEGATIVE mg/dL
Specific Gravity, Urine: 1.025 (ref 1.005–1.030)
Urobilinogen, UA: 0.2 mg/dL (ref 0.0–1.0)

## 2014-05-31 NOTE — Progress Notes (Signed)
Pt reports a soft tissue abdominal mass x 2 years (approx 2 cm), desires for it to be checked.  Palpated a solid, moveable 2 cm mass on right upper abdomen, nontender with palpation.  Pt has OB follow-up anatomy ultrasound scheduled for Thursday, will add soft tissue ultrasound at same visit.  1 hr at next visit.

## 2014-06-02 ENCOUNTER — Ambulatory Visit (HOSPITAL_COMMUNITY)
Admission: RE | Admit: 2014-06-02 | Discharge: 2014-06-02 | Disposition: A | Discharge status: Home / Self Care | Payer: Medicaid Other | Source: Ambulatory Visit | Attending: Family | Admitting: Family

## 2014-06-02 ENCOUNTER — Ambulatory Visit (HOSPITAL_COMMUNITY): Payer: Medicaid Other

## 2014-06-02 ENCOUNTER — Ambulatory Visit (HOSPITAL_COMMUNITY)
Admission: RE | Admit: 2014-06-02 | Discharge: 2014-06-02 | Disposition: A | Discharge status: Home / Self Care | Payer: Medicaid Other | Source: Ambulatory Visit | Attending: Obstetrics and Gynecology | Admitting: Obstetrics and Gynecology

## 2014-06-02 DIAGNOSIS — O99340 Other mental disorders complicating pregnancy, unspecified trimester: Secondary | ICD-10-CM | POA: Diagnosis not present

## 2014-06-02 DIAGNOSIS — F1210 Cannabis abuse, uncomplicated: Secondary | ICD-10-CM | POA: Insufficient documentation

## 2014-06-02 DIAGNOSIS — Z3402 Encounter for supervision of normal first pregnancy, second trimester: Secondary | ICD-10-CM

## 2014-06-02 DIAGNOSIS — Z3689 Encounter for other specified antenatal screening: Secondary | ICD-10-CM | POA: Diagnosis not present

## 2014-06-02 DIAGNOSIS — O99210 Obesity complicating pregnancy, unspecified trimester: Secondary | ICD-10-CM | POA: Diagnosis not present

## 2014-06-02 DIAGNOSIS — Z3492 Encounter for supervision of normal pregnancy, unspecified, second trimester: Secondary | ICD-10-CM

## 2014-06-02 DIAGNOSIS — O0930 Supervision of pregnancy with insufficient antenatal care, unspecified trimester: Secondary | ICD-10-CM | POA: Diagnosis not present

## 2014-06-02 DIAGNOSIS — R1900 Intra-abdominal and pelvic swelling, mass and lump, unspecified site: Secondary | ICD-10-CM | POA: Insufficient documentation

## 2014-06-02 DIAGNOSIS — E669 Obesity, unspecified: Secondary | ICD-10-CM | POA: Insufficient documentation

## 2014-06-02 DIAGNOSIS — O358XX0 Maternal care for other (suspected) fetal abnormality and damage, not applicable or unspecified: Secondary | ICD-10-CM

## 2014-06-02 DIAGNOSIS — O0932 Supervision of pregnancy with insufficient antenatal care, second trimester: Secondary | ICD-10-CM

## 2014-06-06 ENCOUNTER — Other Ambulatory Visit (HOSPITAL_COMMUNITY): Payer: Medicaid Other

## 2014-06-21 ENCOUNTER — Ambulatory Visit (INDEPENDENT_AMBULATORY_CARE_PROVIDER_SITE_OTHER): Payer: Medicaid Other | Admitting: Obstetrics and Gynecology

## 2014-06-21 VITALS — BP 110/69 | HR 96 | Temp 98.3°F | Wt 247.3 lb

## 2014-06-21 DIAGNOSIS — O0930 Supervision of pregnancy with insufficient antenatal care, unspecified trimester: Secondary | ICD-10-CM

## 2014-06-21 DIAGNOSIS — O0932 Supervision of pregnancy with insufficient antenatal care, second trimester: Secondary | ICD-10-CM

## 2014-06-21 DIAGNOSIS — Z3493 Encounter for supervision of normal pregnancy, unspecified, third trimester: Secondary | ICD-10-CM

## 2014-06-21 DIAGNOSIS — Z23 Encounter for immunization: Secondary | ICD-10-CM

## 2014-06-21 DIAGNOSIS — Z3480 Encounter for supervision of other normal pregnancy, unspecified trimester: Secondary | ICD-10-CM

## 2014-06-21 LAB — CBC
HCT: 34 % — ABNORMAL LOW (ref 36.0–46.0)
Hemoglobin: 11.8 g/dL — ABNORMAL LOW (ref 12.0–15.0)
MCH: 35 pg — ABNORMAL HIGH (ref 26.0–34.0)
MCHC: 34.7 g/dL (ref 30.0–36.0)
MCV: 100.9 fL — AB (ref 78.0–100.0)
Platelets: 340 10*3/uL (ref 150–400)
RBC: 3.37 MIL/uL — AB (ref 3.87–5.11)
RDW: 13.4 % (ref 11.5–15.5)
WBC: 11.2 10*3/uL — ABNORMAL HIGH (ref 4.0–10.5)

## 2014-06-21 LAB — POCT URINALYSIS DIP (DEVICE)
BILIRUBIN URINE: NEGATIVE
Glucose, UA: NEGATIVE mg/dL
Hgb urine dipstick: NEGATIVE
KETONES UR: NEGATIVE mg/dL
Leukocytes, UA: NEGATIVE
Nitrite: NEGATIVE
Protein, ur: NEGATIVE mg/dL
Specific Gravity, Urine: 1.025 (ref 1.005–1.030)
Urobilinogen, UA: 0.2 mg/dL (ref 0.0–1.0)
pH: 6 (ref 5.0–8.0)

## 2014-06-21 MED ORDER — TETANUS-DIPHTH-ACELL PERTUSSIS 5-2.5-18.5 LF-MCG/0.5 IM SUSP: 0.5000 mL | Freq: Once | INTRAMUSCULAR | Status: DC

## 2014-06-21 NOTE — Progress Notes (Signed)
C/o pelvic pain/pressure and epigastric pain /pressure off and on last few weeks. States today having epigastric pain /pressure today, states ate hot sausage . States has had cramping/ not sure if it was contractions or not.

## 2014-06-21 NOTE — Patient Instructions (Signed)
Etonogestrel implant What is this medicine? ETONOGESTREL (et oh noe JES trel) is a contraceptive (birth control) device. It is used to prevent pregnancy. It can be used for up to 3 years. This medicine may be used for other purposes; ask your health care provider or pharmacist if you have questions. COMMON BRAND NAME(S): Implanon, Nexplanon What should I tell my health care provider before I take this medicine? They need to know if you have any of these conditions: -abnormal vaginal bleeding -blood vessel disease or blood clots -cancer of the breast, cervix, or liver -depression -diabetes -gallbladder disease -headaches -heart disease or recent heart attack -high blood pressure -high cholesterol -kidney disease -liver disease -renal disease -seizures -tobacco smoker -an unusual or allergic reaction to etonogestrel, other hormones, anesthetics or antiseptics, medicines, foods, dyes, or preservatives -pregnant or trying to get pregnant -breast-feeding How should I use this medicine? This device is inserted just under the skin on the inner side of your upper arm by a health care professional. Talk to your pediatrician regarding the use of this medicine in children. Special care may be needed. Overdosage: If you think you've taken too much of this medicine contact a poison control center or emergency room at once. Overdosage: If you think you have taken too much of this medicine contact a poison control center or emergency room at once. NOTE: This medicine is only for you. Do not share this medicine with others. What if I miss a dose? This does not apply. What may interact with this medicine? Do not take this medicine with any of the following medications: -amprenavir -bosentan -fosamprenavir This medicine may also interact with the following medications: -barbiturate medicines for inducing sleep or treating seizures -certain medicines for fungal infections like ketoconazole and  itraconazole -griseofulvin -medicines to treat seizures like carbamazepine, felbamate, oxcarbazepine, phenytoin, topiramate -modafinil -phenylbutazone -rifampin -some medicines to treat HIV infection like atazanavir, indinavir, lopinavir, nelfinavir, tipranavir, ritonavir -St. John's wort This list may not describe all possible interactions. Give your health care provider a list of all the medicines, herbs, non-prescription drugs, or dietary supplements you use. Also tell them if you smoke, drink alcohol, or use illegal drugs. Some items may interact with your medicine. What should I watch for while using this medicine? This product does not protect you against HIV infection (AIDS) or other sexually transmitted diseases. You should be able to feel the implant by pressing your fingertips over the skin where it was inserted. Tell your doctor if you cannot feel the implant. What side effects may I notice from receiving this medicine? Side effects that you should report to your doctor or health care professional as soon as possible: -allergic reactions like skin rash, itching or hives, swelling of the face, lips, or tongue -breast lumps -changes in vision -confusion, trouble speaking or understanding -dark urine -depressed mood -general ill feeling or flu-like symptoms -light-colored stools -loss of appetite, nausea -right upper belly pain -severe headaches -severe pain, swelling, or tenderness in the abdomen -shortness of breath, chest pain, swelling in a leg -signs of pregnancy -sudden numbness or weakness of the face, arm or leg -trouble walking, dizziness, loss of balance or coordination -unusual vaginal bleeding, discharge -unusually weak or tired -yellowing of the eyes or skin Side effects that usually do not require medical attention (Report these to your doctor or health care professional if they continue or are bothersome.): -acne -breast pain -changes in  weight -cough -fever or chills -headache -irregular menstrual bleeding -itching, burning, and   vaginal discharge -pain or difficulty passing urine -sore throat This list may not describe all possible side effects. Call your doctor for medical advice about side effects. You may report side effects to FDA at 1-800-FDA-1088. Where should I keep my medicine? This drug is given in a hospital or clinic and will not be stored at home. NOTE: This sheet is a summary. It may not cover all possible information. If you have questions about this medicine, talk to your doctor, pharmacist, or health care provider.  2015, [employer]. (2012-03-30 15:37:45)  

## 2014-06-21 NOTE — Progress Notes (Signed)
28 wk labs and tDap today. Decline flu vaccine. Discussed + mj> not using so will recheck UDS. Plans WIC breastfeeding class. Advised other classes. Reviewed plans> Nexplanon info. Had LLQ crampy pain after going to [employer] last week; no upper abd pain or intermittetn abd pains> RLP discussed. PTL sx reviewed. Reviewed last [place] with couple (all nl).

## 2014-06-22 LAB — RPR

## 2014-06-22 LAB — HIV ANTIBODY (ROUTINE TESTING W REFLEX): HIV 1&2 Ab, 4th Generation: NONREACTIVE

## 2014-06-22 LAB — GLUCOSE TOLERANCE, 1 HOUR (50G) W/O FASTING: Glucose, 1 Hour GTT: 72 mg/dL (ref 70–140)

## 2014-07-06 ENCOUNTER — Ambulatory Visit (INDEPENDENT_AMBULATORY_CARE_PROVIDER_SITE_OTHER): Payer: Medicaid Other | Admitting: Physician Assistant

## 2014-07-06 VITALS — BP 114/74 | HR 89 | Temp 98.3°F | Wt 249.0 lb

## 2014-07-06 DIAGNOSIS — Z23 Encounter for immunization: Secondary | ICD-10-CM

## 2014-07-06 DIAGNOSIS — O0930 Supervision of pregnancy with insufficient antenatal care, unspecified trimester: Secondary | ICD-10-CM

## 2014-07-06 DIAGNOSIS — Z3480 Encounter for supervision of other normal pregnancy, unspecified trimester: Secondary | ICD-10-CM

## 2014-07-06 DIAGNOSIS — O0932 Supervision of pregnancy with insufficient antenatal care, second trimester: Secondary | ICD-10-CM

## 2014-07-06 LAB — POCT URINALYSIS DIP (DEVICE)
Bilirubin Urine: NEGATIVE
Glucose, UA: NEGATIVE mg/dL
Hgb urine dipstick: NEGATIVE
Leukocytes, UA: NEGATIVE
Nitrite: NEGATIVE
PROTEIN: NEGATIVE mg/dL
SPECIFIC GRAVITY, URINE: 1.025 (ref 1.005–1.030)
Urobilinogen, UA: 0.2 mg/dL (ref 0.0–1.0)
pH: 5.5 (ref 5.0–8.0)

## 2014-07-06 NOTE — Progress Notes (Signed)
Declines flu vaccine.

## 2014-07-06 NOTE — Patient Instructions (Signed)
Breastfeeding Deciding to breastfeed is one of the best choices you can make for you and your baby. A change in hormones during pregnancy causes your breast tissue to grow and increases the number and size of your milk ducts. These hormones also allow proteins, sugars, and fats from your blood supply to make breast milk in your milk-producing glands. Hormones prevent breast milk from being released before your baby is born as well as prompt milk flow after birth. Once breastfeeding has begun, thoughts of your baby, as well as his or her sucking or crying, can stimulate the release of milk from your milk-producing glands.  BENEFITS OF BREASTFEEDING For Your Baby  Your first milk (colostrum) helps your baby's digestive system function better.   There are antibodies in your milk that help your baby fight off infections.   Your baby has a lower incidence of asthma, allergies, and sudden infant death syndrome.   The nutrients in breast milk are better for your baby than infant formulas and are designed uniquely for your baby's needs.   Breast milk improves your baby's brain development.   Your baby is less likely to develop other conditions, such as childhood obesity, asthma, or type 2 diabetes mellitus.  For You   Breastfeeding helps to create a very special bond between you and your baby.   Breastfeeding is convenient. Breast milk is always available at the correct temperature and costs nothing.   Breastfeeding helps to burn calories and helps you lose the weight gained during pregnancy.   Breastfeeding makes your uterus contract to its prepregnancy size faster and slows bleeding (lochia) after you give birth.   Breastfeeding helps to lower your risk of developing type 2 diabetes mellitus, osteoporosis, and breast or ovarian cancer later in life. SIGNS THAT YOUR BABY IS HUNGRY Early Signs of Hunger  Increased alertness or activity.  Stretching.  Movement of the head from  side to side.  Movement of the head and opening of the mouth when the corner of the mouth or cheek is stroked (rooting).  Increased sucking sounds, smacking lips, cooing, sighing, or squeaking.  Hand-to-mouth movements.  Increased sucking of fingers or hands. Late Signs of Hunger  Fussing.  Intermittent crying. Extreme Signs of Hunger Signs of extreme hunger will require calming and consoling before your baby will be able to breastfeed successfully. Do not wait for the following signs of extreme hunger to occur before you initiate breastfeeding:   Restlessness.  A loud, strong cry.   Screaming. BREASTFEEDING BASICS Breastfeeding Initiation  Find a comfortable place to sit or lie down, with your neck and back well supported.  Place a pillow or rolled up blanket under your baby to bring him or her to the level of your breast (if you are seated). Nursing pillows are specially designed to help support your arms and your baby while you breastfeed.  Make sure that your baby's abdomen is facing your abdomen.   Gently massage your breast. With your fingertips, massage from your chest wall toward your nipple in a circular motion. This encourages milk flow. You may need to continue this action during the feeding if your milk flows slowly.  Support your breast with 4 fingers underneath and your thumb above your nipple. Make sure your fingers are well away from your nipple and your baby's mouth.   Stroke your baby's lips gently with your finger or nipple.   When your baby's mouth is open wide enough, quickly bring your baby to your   breast, placing your entire nipple and as much of the colored area around your nipple (areola) as possible into your baby's mouth.   More areola should be visible above your baby's upper lip than below the lower lip.   Your baby's tongue should be between his or her lower gum and your breast.   Ensure that your baby's mouth is correctly positioned  around your nipple (latched). Your baby's lips should create a seal on your breast and be turned out (everted).  It is common for your baby to suck about 2-3 minutes in order to start the flow of breast milk. Latching Teaching your baby how to latch on to your breast properly is very important. An improper latch can cause nipple pain and decreased milk supply for you and poor weight gain in your baby. Also, if your baby is not latched onto your nipple properly, he or she may swallow some air during feeding. This can make your baby fussy. Burping your baby when you switch breasts during the feeding can help to get rid of the air. However, teaching your baby to latch on properly is still the best way to prevent fussiness from swallowing air while breastfeeding. Signs that your baby has successfully latched on to your nipple:    Silent tugging or silent sucking, without causing you pain.   Swallowing heard between every 3-4 sucks.    Muscle movement above and in front of his or her ears while sucking.  Signs that your baby has not successfully latched on to nipple:   Sucking sounds or smacking sounds from your baby while breastfeeding.  Nipple pain. If you think your baby has not latched on correctly, slip your finger into the corner of your baby's mouth to break the suction and place it between your baby's gums. Attempt breastfeeding initiation again. Signs of Successful Breastfeeding Signs from your baby:   A gradual decrease in the number of sucks or complete cessation of sucking.   Falling asleep.   Relaxation of his or her body.   Retention of a small amount of milk in his or her mouth.   Letting go of your breast by himself or herself. Signs from you:  Breasts that have increased in firmness, weight, and size 1-3 hours after feeding.   Breasts that are softer immediately after breastfeeding.  Increased milk volume, as well as a change in milk consistency and color by  the fifth day of breastfeeding.   Nipples that are not sore, cracked, or bleeding. Signs That Your Baby is Getting Enough Milk  Wetting at least 3 diapers in a 24-hour period. The urine should be clear and pale yellow by age 5 days.  At least 3 stools in a 24-hour period by age 5 days. The stool should be soft and yellow.  At least 3 stools in a 24-hour period by age 7 days. The stool should be seedy and yellow.  No loss of weight greater than 10% of birth weight during the first 3 days of age.  Average weight gain of 4-7 ounces (113-198 g) per week after age 4 days.  Consistent daily weight gain by age 5 days, without weight loss after the age of 2 weeks. After a feeding, your baby may spit up a small amount. This is common. BREASTFEEDING FREQUENCY AND DURATION Frequent feeding will help you make more milk and can prevent sore nipples and breast engorgement. Breastfeed when you feel the need to reduce the fullness of your breasts   or when your baby shows signs of hunger. This is called "breastfeeding on demand." Avoid introducing a pacifier to your baby while you are working to establish breastfeeding (the first 4-6 weeks after your baby is born). After this time you may choose to use a pacifier. Research has shown that pacifier use during the first year of a baby's life decreases the risk of sudden infant death syndrome (SIDS). Allow your baby to feed on each breast as long as he or she wants. Breastfeed until your baby is finished feeding. When your baby unlatches or falls asleep while feeding from the first breast, offer the second breast. Because newborns are often sleepy in the first few weeks of life, you may need to awaken your baby to get him or her to feed. Breastfeeding times will vary from baby to baby. However, the following rules can serve as a guide to help you ensure that your baby is properly fed:  Newborns (babies 4 weeks of age or younger) may breastfeed every 1-3  hours.  Newborns should not go longer than 3 hours during the day or 5 hours during the night without breastfeeding.  You should breastfeed your baby a minimum of 8 times in a 24-hour period until you begin to introduce solid foods to your baby at around 6 months of age. BREAST MILK PUMPING Pumping and storing breast milk allows you to ensure that your baby is exclusively fed your breast milk, even at times when you are unable to breastfeed. This is especially important if you are going back to work while you are still breastfeeding or when you are not able to be present during feedings. Your lactation consultant can give you guidelines on how long it is safe to store breast milk.  A breast pump is a machine that allows you to pump milk from your breast into a sterile bottle. The pumped breast milk can then be stored in a refrigerator or freezer. Some breast pumps are operated by hand, while others use electricity. Ask your lactation consultant which type will work best for you. Breast pumps can be purchased, but some hospitals and breastfeeding support groups lease breast pumps on a monthly basis. A lactation consultant can teach you how to hand express breast milk, if you prefer not to use a pump.  CARING FOR YOUR BREASTS WHILE YOU BREASTFEED Nipples can become dry, cracked, and sore while breastfeeding. The following recommendations can help keep your breasts moisturized and healthy:  Avoid using soap on your nipples.   Wear a supportive bra. Although not required, special nursing bras and tank tops are designed to allow access to your breasts for breastfeeding without taking off your entire bra or top. Avoid wearing underwire-style bras or extremely tight bras.  Air dry your nipples for 3-4minutes after each feeding.   Use only cotton bra pads to absorb leaked breast milk. Leaking of breast milk between feedings is normal.   Use lanolin on your nipples after breastfeeding. Lanolin helps to  maintain your skin's normal moisture barrier. If you use pure lanolin, you do not need to wash it off before feeding your baby again. Pure lanolin is not toxic to your baby. You may also hand express a few drops of breast milk and gently massage that milk into your nipples and allow the milk to air dry. In the first few weeks after giving birth, some women experience extremely full breasts (engorgement). Engorgement can make your breasts feel heavy, warm, and tender to the   touch. Engorgement peaks within 3-5 days after you give birth. The following recommendations can help ease engorgement:  Completely empty your breasts while breastfeeding or pumping. You may want to start by applying warm, moist heat (in the shower or with warm water-soaked hand towels) just before feeding or pumping. This increases circulation and helps the milk flow. If your baby does not completely empty your breasts while breastfeeding, pump any extra milk after he or she is finished.  Wear a snug bra (nursing or regular) or tank top for 1-2 days to signal your body to slightly decrease milk production.  Apply ice packs to your breasts, unless this is too uncomfortable for you.  Make sure that your baby is latched on and positioned properly while breastfeeding. If engorgement persists after 48 hours of following these recommendations, contact your health care provider or a lactation consultant. OVERALL HEALTH CARE RECOMMENDATIONS WHILE BREASTFEEDING  Eat healthy foods. Alternate between meals and snacks, eating 3 of each per day. Because what you eat affects your breast milk, some of the foods may make your baby more irritable than usual. Avoid eating these foods if you are sure that they are negatively affecting your baby.  Drink milk, fruit juice, and water to satisfy your thirst (about 10 glasses a day).   Rest often, relax, and continue to take your prenatal vitamins to prevent fatigue, stress, and anemia.  Continue  breast self-awareness checks.  Avoid chewing and smoking tobacco.  Avoid alcohol and drug use. Some medicines that may be harmful to your baby can pass through breast milk. It is important to ask your health care provider before taking any medicine, including all over-the-counter and prescription medicine as well as vitamin and herbal supplements. It is possible to become pregnant while breastfeeding. If birth control is desired, ask your health care provider about options that will be safe for your baby. SEEK MEDICAL CARE IF:   You feel like you want to stop breastfeeding or have become frustrated with breastfeeding.  You have painful breasts or nipples.  Your nipples are cracked or bleeding.  Your breasts are red, tender, or warm.  You have a swollen area on either breast.  You have a fever or chills.  You have nausea or vomiting.  You have drainage other than breast milk from your nipples.  Your breasts do not become full before feedings by the fifth day after you give birth.  You feel sad and depressed.  Your baby is too sleepy to eat well.  Your baby is having trouble sleeping.   Your baby is wetting less than 3 diapers in a 24-hour period.  Your baby has less than 3 stools in a 24-hour period.  Your baby's skin or the white part of his or her eyes becomes yellow.   Your baby is not gaining weight by 5 days of age. SEEK IMMEDIATE MEDICAL CARE IF:   Your baby is overly tired (lethargic) and does not want to wake up and feed.  Your baby develops an unexplained fever. Document Released: 09/23/2005 Document Revised: 09/28/2013 Document Reviewed: 03/17/2013 [employer] Patient Information 2015 [employer]. This information is not intended to replace advice given to you by your health care provider. Make sure you discuss any questions you have with your health care provider.  

## 2014-07-06 NOTE — Progress Notes (Signed)
31 weeks, no complaints.  Denies contractions, vaginal bleeding, LOF, dysuria. Endorses good fetal movement.   Cont daily PNV Discussed contraception, favors Nexplanon.   RTC 2 weeks.

## 2014-07-20 ENCOUNTER — Ambulatory Visit (INDEPENDENT_AMBULATORY_CARE_PROVIDER_SITE_OTHER): Payer: Medicaid Other | Admitting: Family

## 2014-07-20 VITALS — BP 110/71 | HR 98 | Wt 254.9 lb

## 2014-07-20 DIAGNOSIS — O0932 Supervision of pregnancy with insufficient antenatal care, second trimester: Secondary | ICD-10-CM

## 2014-07-20 LAB — POCT URINALYSIS DIP (DEVICE)
Bilirubin Urine: NEGATIVE
Glucose, UA: NEGATIVE mg/dL
HGB URINE DIPSTICK: NEGATIVE
Ketones, ur: NEGATIVE mg/dL
Leukocytes, UA: NEGATIVE
Nitrite: NEGATIVE
Protein, ur: NEGATIVE mg/dL
SPECIFIC GRAVITY, URINE: 1.015 (ref 1.005–1.030)
UROBILINOGEN UA: 0.2 mg/dL (ref 0.0–1.0)
pH: 5 (ref 5.0–8.0)

## 2014-07-26 NOTE — Progress Notes (Signed)
No questions or concerns.  Reviewed PTL precautions.

## 2014-08-04 ENCOUNTER — Ambulatory Visit (INDEPENDENT_AMBULATORY_CARE_PROVIDER_SITE_OTHER): Payer: Medicaid Other | Admitting: Family Medicine

## 2014-08-04 ENCOUNTER — Other Ambulatory Visit: Payer: Self-pay | Admitting: Family Medicine

## 2014-08-04 VITALS — BP 119/65 | HR 108 | Temp 98.6°F | Wt 257.4 lb

## 2014-08-04 DIAGNOSIS — Z3493 Encounter for supervision of normal pregnancy, unspecified, third trimester: Secondary | ICD-10-CM

## 2014-08-04 DIAGNOSIS — O0932 Supervision of pregnancy with insufficient antenatal care, second trimester: Secondary | ICD-10-CM

## 2014-08-04 LAB — POCT URINALYSIS DIP (DEVICE)
Bilirubin Urine: NEGATIVE
GLUCOSE, UA: NEGATIVE mg/dL
HGB URINE DIPSTICK: NEGATIVE
KETONES UR: NEGATIVE mg/dL
Nitrite: NEGATIVE
Protein, ur: NEGATIVE mg/dL
SPECIFIC GRAVITY, URINE: 1.025 (ref 1.005–1.030)
Urobilinogen, UA: 0.2 mg/dL (ref 0.0–1.0)
pH: 6 (ref 5.0–8.0)

## 2014-08-04 LAB — OB RESULTS CONSOLE GC/CHLAMYDIA
CHLAMYDIA, DNA PROBE: NEGATIVE
GC PROBE AMP, GENITAL: NEGATIVE

## 2014-08-04 LAB — OB RESULTS CONSOLE GBS: STREP GROUP B AG: POSITIVE

## 2014-08-04 NOTE — Progress Notes (Signed)
Patient without complaints.  Denies vaginal bleeding, abnormal vaginal discharge, contractions, loss of fluid.  Denies abdominal pain, headache, scotoma.  Reports good fetal activity.  Labor precautions reviewed.  Follow up in 1 weeks.  

## 2014-08-04 NOTE — Progress Notes (Signed)
Cultures today Patient is concerned about traveling around due date

## 2014-08-04 NOTE — Patient Instructions (Signed)
Third Trimester of Pregnancy The third trimester is from week 29 through week 42, months 7 through 9. The third trimester is a time when the fetus is growing rapidly. At the end of the ninth month, the fetus is about 20 inches in length and weighs 6-10 pounds.  BODY CHANGES Your body goes through many changes during pregnancy. The changes vary from woman to woman.   Your weight will continue to increase. You can expect to gain 25-35 pounds (11-16 kg) by the end of the pregnancy.  You may begin to get stretch marks on your hips, abdomen, and breasts.  You may urinate more often because the fetus is moving lower into your pelvis and pressing on your bladder.  You may develop or continue to have heartburn as a result of your pregnancy.  You may develop constipation because certain hormones are causing the muscles that push waste through your intestines to slow down.  You may develop hemorrhoids or swollen, bulging veins (varicose veins).  You may have pelvic pain because of the weight gain and pregnancy hormones relaxing your joints between the bones in your pelvis. Backaches may result from overexertion of the muscles supporting your posture.  You may have changes in your hair. These can include thickening of your hair, rapid growth, and changes in texture. Some women also have hair loss during or after pregnancy, or hair that feels dry or thin. Your hair will most likely return to normal after your baby is born.  Your breasts will continue to grow and be tender. A yellow discharge may leak from your breasts called colostrum.  Your belly button may stick out.  You may feel short of breath because of your expanding uterus.  You may notice the fetus "dropping," or moving lower in your abdomen.  You may have a bloody mucus discharge. This usually occurs a few days to a week before labor begins.  Your cervix becomes thin and soft (effaced) near your due date. WHAT TO EXPECT AT YOUR PRENATAL  EXAMS  You will have prenatal exams every 2 weeks until week 36. Then, you will have weekly prenatal exams. During a routine prenatal visit:  You will be weighed to make sure you and the fetus are growing normally.  Your blood pressure is taken.  Your abdomen will be measured to track your baby's growth.  The fetal heartbeat will be listened to.  Any test results from the previous visit will be discussed.  You may have a cervical check near your due date to see if you have effaced. At around 36 weeks, your caregiver will check your cervix. At the same time, your caregiver will also perform a test on the secretions of the vaginal tissue. This test is to determine if a type of bacteria, Group B streptococcus, is present. Your caregiver will explain this further. Your caregiver may ask you:  What your birth plan is.  How you are feeling.  If you are feeling the baby move.  If you have had any abnormal symptoms, such as leaking fluid, bleeding, severe headaches, or abdominal cramping.  If you have any questions. Other tests or screenings that may be performed during your third trimester include:  Blood tests that check for low iron levels (anemia).  Fetal testing to check the health, activity level, and growth of the fetus. Testing is done if you have certain medical conditions or if there are problems during the pregnancy. FALSE LABOR You may feel small, irregular contractions that   eventually go away. These are called Braxton Hicks contractions, or false labor. Contractions may last for hours, days, or even weeks before true labor sets in. If contractions come at regular intervals, intensify, or become painful, it is best to be seen by your caregiver.  SIGNS OF LABOR   Menstrual-like cramps.  Contractions that are 5 minutes apart or less.  Contractions that start on the top of the uterus and spread down to the lower abdomen and back.  A sense of increased pelvic pressure or back  pain.  A watery or bloody mucus discharge that comes from the vagina. If you have any of these signs before the 37th week of pregnancy, call your caregiver right away. You need to go to the hospital to get checked immediately. HOME CARE INSTRUCTIONS   Avoid all smoking, herbs, alcohol, and unprescribed drugs. These chemicals affect the formation and growth of the baby.  Follow your caregiver's instructions regarding medicine use. There are medicines that are either safe or unsafe to take during pregnancy.  Exercise only as directed by your caregiver. Experiencing uterine cramps is a good sign to stop exercising.  Continue to eat regular, healthy meals.  Wear a good support bra for breast tenderness.  Do not use hot tubs, steam rooms, or saunas.  Wear your seat belt at all times when driving.  Avoid raw meat, uncooked cheese, cat litter boxes, and soil used by cats. These carry germs that can cause birth defects in the baby.  Take your prenatal vitamins.  Try taking a stool softener (if your caregiver approves) if you develop constipation. Eat more high-fiber foods, such as fresh vegetables or fruit and whole grains. Drink plenty of fluids to keep your urine clear or pale yellow.  Take warm sitz baths to soothe any pain or discomfort caused by hemorrhoids. Use hemorrhoid cream if your caregiver approves.  If you develop varicose veins, wear support hose. Elevate your feet for 15 minutes, 3-4 times a day. Limit salt in your diet.  Avoid heavy lifting, wear low heal shoes, and practice good posture.  Rest a lot with your legs elevated if you have leg cramps or low back pain.  Visit your dentist if you have not gone during your pregnancy. Use a soft toothbrush to brush your teeth and be gentle when you floss.  A sexual relationship may be continued unless your caregiver directs you otherwise.  Do not travel far distances unless it is absolutely necessary and only with the approval  of your caregiver.  Take prenatal classes to understand, practice, and ask questions about the labor and delivery.  Make a trial run to the hospital.  Pack your hospital bag.  Prepare the baby's nursery.  Continue to go to all your prenatal visits as directed by your caregiver. SEEK MEDICAL CARE IF:  You are unsure if you are in labor or if your water has broken.  You have dizziness.  You have mild pelvic cramps, pelvic pressure, or nagging pain in your abdominal area.  You have persistent nausea, vomiting, or diarrhea.  You have a bad smelling vaginal discharge.  You have pain with urination. SEEK IMMEDIATE MEDICAL CARE IF:   You have a fever.  You are leaking fluid from your vagina.  You have spotting or bleeding from your vagina.  You have severe abdominal cramping or pain.  You have rapid weight loss or gain.  You have shortness of breath with chest pain.  You notice sudden or extreme swelling   of your face, hands, ankles, feet, or legs.  You have not felt your baby move in over an hour.  You have severe headaches that do not go away with medicine.  You have vision changes. Document Released: 09/17/2001 Document Revised: 09/28/2013 Document Reviewed: 11/24/2012 [employer] Patient Information 2015 [employer]. This information is not intended to replace advice given to you by your health care provider. Make sure you discuss any questions you have with your health care provider.  

## 2014-08-05 LAB — GC/CHLAMYDIA PROBE AMP
CT Probe RNA: NEGATIVE
GC PROBE AMP APTIMA: NEGATIVE

## 2014-08-06 ENCOUNTER — Encounter: Payer: Self-pay | Admitting: Family Medicine

## 2014-08-06 LAB — CULTURE, BETA STREP (GROUP B ONLY)

## 2014-08-08 ENCOUNTER — Encounter: Payer: Self-pay | Admitting: Obstetrics and Gynecology

## 2014-08-11 ENCOUNTER — Encounter: Payer: Self-pay | Admitting: Obstetrics and Gynecology

## 2014-08-11 ENCOUNTER — Ambulatory Visit (INDEPENDENT_AMBULATORY_CARE_PROVIDER_SITE_OTHER): Payer: Medicaid Other | Admitting: Obstetrics and Gynecology

## 2014-08-11 VITALS — BP 118/78 | HR 95 | Temp 98.4°F | Wt 263.0 lb

## 2014-08-11 DIAGNOSIS — Z3493 Encounter for supervision of normal pregnancy, unspecified, third trimester: Secondary | ICD-10-CM

## 2014-08-11 LAB — POCT URINALYSIS DIP (DEVICE)
Bilirubin Urine: NEGATIVE
GLUCOSE, UA: NEGATIVE mg/dL
Hgb urine dipstick: NEGATIVE
Ketones, ur: NEGATIVE mg/dL
LEUKOCYTES UA: NEGATIVE
NITRITE: NEGATIVE
Protein, ur: NEGATIVE mg/dL
Specific Gravity, Urine: >=1.03 (ref 1.005–1.030)
UROBILINOGEN UA: 0.2 mg/dL (ref 0.0–1.0)
pH: 5.5 (ref 5.0–8.0)

## 2014-08-11 NOTE — Patient Instructions (Signed)
Etonogestrel implant What is this medicine? ETONOGESTREL (et oh noe JES trel) is a contraceptive (birth control) device. It is used to prevent pregnancy. It can be used for up to 3 years. This medicine may be used for other purposes; ask your health care provider or pharmacist if you have questions. COMMON BRAND NAME(S): Implanon, Nexplanon What should I tell my health care provider before I take this medicine? They need to know if you have any of these conditions: -abnormal vaginal bleeding -blood vessel disease or blood clots -cancer of the breast, cervix, or liver -depression -diabetes -gallbladder disease -headaches -heart disease or recent heart attack -high blood pressure -high cholesterol -kidney disease -liver disease -renal disease -seizures -tobacco smoker -an unusual or allergic reaction to etonogestrel, other hormones, anesthetics or antiseptics, medicines, foods, dyes, or preservatives -pregnant or trying to get pregnant -breast-feeding How should I use this medicine? This device is inserted just under the skin on the inner side of your upper arm by a health care professional. Talk to your pediatrician regarding the use of this medicine in children. Special care may be needed. Overdosage: If you think you've taken too much of this medicine contact a poison control center or emergency room at once. Overdosage: If you think you have taken too much of this medicine contact a poison control center or emergency room at once. NOTE: This medicine is only for you. Do not share this medicine with others. What if I miss a dose? This does not apply. What may interact with this medicine? Do not take this medicine with any of the following medications: -amprenavir -bosentan -fosamprenavir This medicine may also interact with the following medications: -barbiturate medicines for inducing sleep or treating seizures -certain medicines for fungal infections like ketoconazole and  itraconazole -griseofulvin -medicines to treat seizures like carbamazepine, felbamate, oxcarbazepine, phenytoin, topiramate -modafinil -phenylbutazone -rifampin -some medicines to treat HIV infection like atazanavir, indinavir, lopinavir, nelfinavir, tipranavir, ritonavir -St. John's wort This list may not describe all possible interactions. Give your health care provider a list of all the medicines, herbs, non-prescription drugs, or dietary supplements you use. Also tell them if you smoke, drink alcohol, or use illegal drugs. Some items may interact with your medicine. What should I watch for while using this medicine? This product does not protect you against HIV infection (AIDS) or other sexually transmitted diseases. You should be able to feel the implant by pressing your fingertips over the skin where it was inserted. Tell your doctor if you cannot feel the implant. What side effects may I notice from receiving this medicine? Side effects that you should report to your doctor or health care professional as soon as possible: -allergic reactions like skin rash, itching or hives, swelling of the face, lips, or tongue -breast lumps -changes in vision -confusion, trouble speaking or understanding -dark urine -depressed mood -general ill feeling or flu-like symptoms -light-colored stools -loss of appetite, nausea -right upper belly pain -severe headaches -severe pain, swelling, or tenderness in the abdomen -shortness of breath, chest pain, swelling in a leg -signs of pregnancy -sudden numbness or weakness of the face, arm or leg -trouble walking, dizziness, loss of balance or coordination -unusual vaginal bleeding, discharge -unusually weak or tired -yellowing of the eyes or skin Side effects that usually do not require medical attention (Report these to your doctor or health care professional if they continue or are bothersome.): -acne -breast pain -changes in  weight -cough -fever or chills -headache -irregular menstrual bleeding -itching, burning, and   vaginal discharge -pain or difficulty passing urine -sore throat This list may not describe all possible side effects. Call your doctor for medical advice about side effects. You may report side effects to FDA at 1-800-FDA-1088. Where should I keep my medicine? This drug is given in a hospital or clinic and will not be stored at home. NOTE: This sheet is a summary. It may not cover all possible information. If you have questions about this medicine, talk to your doctor, pharmacist, or health care provider.  2015, [employer]. (2012-03-30 15:37:45)  

## 2014-08-11 NOTE — Progress Notes (Signed)
Doing well. Explained GBS carriage. [place] [facility] [place] [facility].

## 2014-08-19 ENCOUNTER — Ambulatory Visit (INDEPENDENT_AMBULATORY_CARE_PROVIDER_SITE_OTHER): Payer: Medicaid Other | Admitting: Family Medicine

## 2014-08-19 VITALS — BP 119/71 | HR 108 | Temp 97.5°F | Wt 268.2 lb

## 2014-08-19 DIAGNOSIS — O0932 Supervision of pregnancy with insufficient antenatal care, second trimester: Secondary | ICD-10-CM

## 2014-08-19 NOTE — Patient Instructions (Signed)
Third Trimester of Pregnancy The third trimester is from week 29 through week 42, months 7 through 9. The third trimester is a time when the fetus is growing rapidly. At the end of the ninth month, the fetus is about 20 inches in length and weighs 6-10 pounds.  BODY CHANGES Your body goes through many changes during pregnancy. The changes vary from woman to woman.   Your weight will continue to increase. You can expect to gain 25-35 pounds (11-16 kg) by the end of the pregnancy.  You may begin to get stretch marks on your hips, abdomen, and breasts.  You may urinate more often because the fetus is moving lower into your pelvis and pressing on your bladder.  You may develop or continue to have heartburn as a result of your pregnancy.  You may develop constipation because certain hormones are causing the muscles that push waste through your intestines to slow down.  You may develop hemorrhoids or swollen, bulging veins (varicose veins).  You may have pelvic pain because of the weight gain and pregnancy hormones relaxing your joints between the bones in your pelvis. Backaches may result from overexertion of the muscles supporting your posture.  You may have changes in your hair. These can include thickening of your hair, rapid growth, and changes in texture. Some women also have hair loss during or after pregnancy, or hair that feels dry or thin. Your hair will most likely return to normal after your baby is born.  Your breasts will continue to grow and be tender. A yellow discharge may leak from your breasts called colostrum.  Your belly button may stick out.  You may feel short of breath because of your expanding uterus.  You may notice the fetus "dropping," or moving lower in your abdomen.  You may have a bloody mucus discharge. This usually occurs a few days to a week before labor begins.  Your cervix becomes thin and soft (effaced) near your due date. WHAT TO EXPECT AT YOUR PRENATAL  EXAMS  You will have prenatal exams every 2 weeks until week 36. Then, you will have weekly prenatal exams. During a routine prenatal visit:  You will be weighed to make sure you and the fetus are growing normally.  Your blood pressure is taken.  Your abdomen will be measured to track your baby's growth.  The fetal heartbeat will be listened to.  Any test results from the previous visit will be discussed.  You may have a cervical check near your due date to see if you have effaced. At around 36 weeks, your caregiver will check your cervix. At the same time, your caregiver will also perform a test on the secretions of the vaginal tissue. This test is to determine if a type of bacteria, Group B streptococcus, is present. Your caregiver will explain this further. Your caregiver may ask you:  What your birth plan is.  How you are feeling.  If you are feeling the baby move.  If you have had any abnormal symptoms, such as leaking fluid, bleeding, severe headaches, or abdominal cramping.  If you have any questions. Other tests or screenings that may be performed during your third trimester include:  Blood tests that check for low iron levels (anemia).  Fetal testing to check the health, activity level, and growth of the fetus. Testing is done if you have certain medical conditions or if there are problems during the pregnancy. FALSE LABOR You may feel small, irregular contractions that   eventually go away. These are called Braxton Hicks contractions, or false labor. Contractions may last for hours, days, or even weeks before true labor sets in. If contractions come at regular intervals, intensify, or become painful, it is best to be seen by your caregiver.  SIGNS OF LABOR   Menstrual-like cramps.  Contractions that are 5 minutes apart or less.  Contractions that start on the top of the uterus and spread down to the lower abdomen and back.  A sense of increased pelvic pressure or back  pain.  A watery or bloody mucus discharge that comes from the vagina. If you have any of these signs before the 37th week of pregnancy, call your caregiver right away. You need to go to the hospital to get checked immediately. HOME CARE INSTRUCTIONS   Avoid all smoking, herbs, alcohol, and unprescribed drugs. These chemicals affect the formation and growth of the baby.  Follow your caregiver's instructions regarding medicine use. There are medicines that are either safe or unsafe to take during pregnancy.  Exercise only as directed by your caregiver. Experiencing uterine cramps is a good sign to stop exercising.  Continue to eat regular, healthy meals.  Wear a good support bra for breast tenderness.  Do not use hot tubs, steam rooms, or saunas.  Wear your seat belt at all times when driving.  Avoid raw meat, uncooked cheese, cat litter boxes, and soil used by cats. These carry germs that can cause birth defects in the baby.  Take your prenatal vitamins.  Try taking a stool softener (if your caregiver approves) if you develop constipation. Eat more high-fiber foods, such as fresh vegetables or fruit and whole grains. Drink plenty of fluids to keep your urine clear or pale yellow.  Take warm sitz baths to soothe any pain or discomfort caused by hemorrhoids. Use hemorrhoid cream if your caregiver approves.  If you develop varicose veins, wear support hose. Elevate your feet for 15 minutes, 3-4 times a day. Limit salt in your diet.  Avoid heavy lifting, wear low heal shoes, and practice good posture.  Rest a lot with your legs elevated if you have leg cramps or low back pain.  Visit your dentist if you have not gone during your pregnancy. Use a soft toothbrush to brush your teeth and be gentle when you floss.  A sexual relationship may be continued unless your caregiver directs you otherwise.  Do not travel far distances unless it is absolutely necessary and only with the approval  of your caregiver.  Take prenatal classes to understand, practice, and ask questions about the labor and delivery.  Make a trial run to the hospital.  Pack your hospital bag.  Prepare the baby's nursery.  Continue to go to all your prenatal visits as directed by your caregiver. SEEK MEDICAL CARE IF:  You are unsure if you are in labor or if your water has broken.  You have dizziness.  You have mild pelvic cramps, pelvic pressure, or nagging pain in your abdominal area.  You have persistent nausea, vomiting, or diarrhea.  You have a bad smelling vaginal discharge.  You have pain with urination. SEEK IMMEDIATE MEDICAL CARE IF:   You have a fever.  You are leaking fluid from your vagina.  You have spotting or bleeding from your vagina.  You have severe abdominal cramping or pain.  You have rapid weight loss or gain.  You have shortness of breath with chest pain.  You notice sudden or extreme swelling   of your face, hands, ankles, feet, or legs.  You have not felt your baby move in over an hour.  You have severe headaches that do not go away with medicine.  You have vision changes. Document Released: 09/17/2001 Document Revised: 09/28/2013 Document Reviewed: 11/24/2012 [employer] Patient Information 2015 [employer]. This information is not intended to replace advice given to you by your health care provider. Make sure you discuss any questions you have with your health care provider.  

## 2014-08-19 NOTE — Progress Notes (Signed)
Reports edema in ankles and feet.  Believes she passed a kidney stone on Monday-- reports feeling similar pain to the pain she experienced when passing one before.

## 2014-08-19 NOTE — Progress Notes (Signed)
Patient without complaints.  Denies vaginal bleeding, abnormal vaginal discharge, contractions, loss of fluid.  Denies abdominal pain, headache, scotoma.  Reports good fetal activity.  Labor precautions reviewed.  Follow up in 1 weeks.  

## 2014-08-22 LAB — POCT URINALYSIS DIP (DEVICE)
Bilirubin Urine: NEGATIVE
Glucose, UA: NEGATIVE mg/dL
Hgb urine dipstick: NEGATIVE
Ketones, ur: NEGATIVE mg/dL
Leukocytes, UA: NEGATIVE
Nitrite: NEGATIVE
Protein, ur: >=300 mg/dL — AB
Specific Gravity, Urine: 1.01 (ref 1.005–1.030)
Urobilinogen, UA: 1 mg/dL (ref 0.0–1.0)
pH: >=9 (ref 5.0–8.0)

## 2014-08-24 ENCOUNTER — Ambulatory Visit (INDEPENDENT_AMBULATORY_CARE_PROVIDER_SITE_OTHER): Payer: Medicaid Other | Admitting: Advanced Practice Midwife

## 2014-08-24 VITALS — BP 113/75 | HR 105 | Temp 97.3°F | Wt 267.7 lb

## 2014-08-24 DIAGNOSIS — O0932 Supervision of pregnancy with insufficient antenatal care, second trimester: Secondary | ICD-10-CM

## 2014-08-24 LAB — POCT URINALYSIS DIP (DEVICE)
BILIRUBIN URINE: NEGATIVE
GLUCOSE, UA: NEGATIVE mg/dL
Hgb urine dipstick: NEGATIVE
KETONES UR: NEGATIVE mg/dL
LEUKOCYTES UA: NEGATIVE
Nitrite: NEGATIVE
Protein, ur: NEGATIVE mg/dL
Specific Gravity, Urine: 1.015 (ref 1.005–1.030)
Urobilinogen, UA: 0.2 mg/dL (ref 0.0–1.0)
pH: 6 (ref 5.0–8.0)

## 2014-08-24 NOTE — Progress Notes (Signed)
Doing well.  Good fetal movement, denies vaginal bleeding, LOF, regular contractions.  Reports chewing ice regularly, asks about anemia.  Hgb 11.8 at 28 weeks.  Recommend continue PNV and eat iron rich foods, no other intervention needed.  Ok to chew ice if careful not to damage teeth.  Desires cervical exam because plans to go out of town for Thanksgiving.  Reviewed safe travel recommendations.  Pt to obtain prenatal records today to take with her out of town.  Pt familiar with area and local OB hospital if needed while away.

## 2014-08-31 ENCOUNTER — Ambulatory Visit (INDEPENDENT_AMBULATORY_CARE_PROVIDER_SITE_OTHER): Payer: Medicaid Other | Admitting: Family Medicine

## 2014-08-31 VITALS — BP 113/83 | HR 81 | Temp 98.1°F | Wt 269.8 lb

## 2014-08-31 DIAGNOSIS — O0932 Supervision of pregnancy with insufficient antenatal care, second trimester: Secondary | ICD-10-CM

## 2014-08-31 LAB — POCT URINALYSIS DIP (DEVICE)
Bilirubin Urine: NEGATIVE
GLUCOSE, UA: NEGATIVE mg/dL
Hgb urine dipstick: NEGATIVE
KETONES UR: NEGATIVE mg/dL
LEUKOCYTES UA: NEGATIVE
Nitrite: NEGATIVE
Protein, ur: NEGATIVE mg/dL
Urobilinogen, UA: 0.2 mg/dL (ref 0.0–1.0)
pH: 5.5 (ref 5.0–8.0)

## 2014-08-31 NOTE — Progress Notes (Signed)
Patient without complaints.  Denies vaginal bleeding, abnormal vaginal discharge, contractions, loss of fluid.  Denies abdominal pain, headache, scotoma.  Reports good fetal activity.  Labor precautions reviewed.  Follow up in 1 weeks.  

## 2014-08-31 NOTE — Patient Instructions (Signed)
Third Trimester of Pregnancy The third trimester is from week 29 through week 42, months 7 through 9. The third trimester is a time when the fetus is growing rapidly. At the end of the ninth month, the fetus is about 20 inches in length and weighs 6-10 pounds.  BODY CHANGES Your body goes through many changes during pregnancy. The changes vary from woman to woman.   Your weight will continue to increase. You can expect to gain 25-35 pounds (11-16 kg) by the end of the pregnancy.  You may begin to get stretch marks on your hips, abdomen, and breasts.  You may urinate more often because the fetus is moving lower into your pelvis and pressing on your bladder.  You may develop or continue to have heartburn as a result of your pregnancy.  You may develop constipation because certain hormones are causing the muscles that push waste through your intestines to slow down.  You may develop hemorrhoids or swollen, bulging veins (varicose veins).  You may have pelvic pain because of the weight gain and pregnancy hormones relaxing your joints between the bones in your pelvis. Backaches may result from overexertion of the muscles supporting your posture.  You may have changes in your hair. These can include thickening of your hair, rapid growth, and changes in texture. Some women also have hair loss during or after pregnancy, or hair that feels dry or thin. Your hair will most likely return to normal after your baby is born.  Your breasts will continue to grow and be tender. A yellow discharge may leak from your breasts called colostrum.  Your belly button may stick out.  You may feel short of breath because of your expanding uterus.  You may notice the fetus "dropping," or moving lower in your abdomen.  You may have a bloody mucus discharge. This usually occurs a few days to a week before labor begins.  Your cervix becomes thin and soft (effaced) near your due date. WHAT TO EXPECT AT YOUR PRENATAL  EXAMS  You will have prenatal exams every 2 weeks until week 36. Then, you will have weekly prenatal exams. During a routine prenatal visit:  You will be weighed to make sure you and the fetus are growing normally.  Your blood pressure is taken.  Your abdomen will be measured to track your baby's growth.  The fetal heartbeat will be listened to.  Any test results from the previous visit will be discussed.  You may have a cervical check near your due date to see if you have effaced. At around 36 weeks, your caregiver will check your cervix. At the same time, your caregiver will also perform a test on the secretions of the vaginal tissue. This test is to determine if a type of bacteria, Group B streptococcus, is present. Your caregiver will explain this further. Your caregiver may ask you:  What your birth plan is.  How you are feeling.  If you are feeling the baby move.  If you have had any abnormal symptoms, such as leaking fluid, bleeding, severe headaches, or abdominal cramping.  If you have any questions. Other tests or screenings that may be performed during your third trimester include:  Blood tests that check for low iron levels (anemia).  Fetal testing to check the health, activity level, and growth of the fetus. Testing is done if you have certain medical conditions or if there are problems during the pregnancy. FALSE LABOR You may feel small, irregular contractions that   eventually go away. These are called Braxton Hicks contractions, or false labor. Contractions may last for hours, days, or even weeks before true labor sets in. If contractions come at regular intervals, intensify, or become painful, it is best to be seen by your caregiver.  SIGNS OF LABOR   Menstrual-like cramps.  Contractions that are 5 minutes apart or less.  Contractions that start on the top of the uterus and spread down to the lower abdomen and back.  A sense of increased pelvic pressure or back  pain.  A watery or bloody mucus discharge that comes from the vagina. If you have any of these signs before the 37th week of pregnancy, call your caregiver right away. You need to go to the hospital to get checked immediately. HOME CARE INSTRUCTIONS   Avoid all smoking, herbs, alcohol, and unprescribed drugs. These chemicals affect the formation and growth of the baby.  Follow your caregiver's instructions regarding medicine use. There are medicines that are either safe or unsafe to take during pregnancy.  Exercise only as directed by your caregiver. Experiencing uterine cramps is a good sign to stop exercising.  Continue to eat regular, healthy meals.  Wear a good support bra for breast tenderness.  Do not use hot tubs, steam rooms, or saunas.  Wear your seat belt at all times when driving.  Avoid raw meat, uncooked cheese, cat litter boxes, and soil used by cats. These carry germs that can cause birth defects in the baby.  Take your prenatal vitamins.  Try taking a stool softener (if your caregiver approves) if you develop constipation. Eat more high-fiber foods, such as fresh vegetables or fruit and whole grains. Drink plenty of fluids to keep your urine clear or pale yellow.  Take warm sitz baths to soothe any pain or discomfort caused by hemorrhoids. Use hemorrhoid cream if your caregiver approves.  If you develop varicose veins, wear support hose. Elevate your feet for 15 minutes, 3-4 times a day. Limit salt in your diet.  Avoid heavy lifting, wear low heal shoes, and practice good posture.  Rest a lot with your legs elevated if you have leg cramps or low back pain.  Visit your dentist if you have not gone during your pregnancy. Use a soft toothbrush to brush your teeth and be gentle when you floss.  A sexual relationship may be continued unless your caregiver directs you otherwise.  Do not travel far distances unless it is absolutely necessary and only with the approval  of your caregiver.  Take prenatal classes to understand, practice, and ask questions about the labor and delivery.  Make a trial run to the hospital.  Pack your hospital bag.  Prepare the baby's nursery.  Continue to go to all your prenatal visits as directed by your caregiver. SEEK MEDICAL CARE IF:  You are unsure if you are in labor or if your water has broken.  You have dizziness.  You have mild pelvic cramps, pelvic pressure, or nagging pain in your abdominal area.  You have persistent nausea, vomiting, or diarrhea.  You have a bad smelling vaginal discharge.  You have pain with urination. SEEK IMMEDIATE MEDICAL CARE IF:   You have a fever.  You are leaking fluid from your vagina.  You have spotting or bleeding from your vagina.  You have severe abdominal cramping or pain.  You have rapid weight loss or gain.  You have shortness of breath with chest pain.  You notice sudden or extreme swelling   of your face, hands, ankles, feet, or legs.  You have not felt your baby move in over an hour.  You have severe headaches that do not go away with medicine.  You have vision changes. Document Released: 09/17/2001 Document Revised: 09/28/2013 Document Reviewed: 11/24/2012 [employer] Patient Information 2015 [employer]. This information is not intended to replace advice given to you by your health care provider. Make sure you discuss any questions you have with your health care provider.  

## 2014-09-03 ENCOUNTER — Inpatient Hospital Stay (HOSPITAL_COMMUNITY)
Admission: AD | Admit: 2014-09-03 | Discharge: 2014-09-05 | DRG: 775 | Disposition: A | Discharge status: Home / Self Care | Payer: Medicaid Other | Source: Ambulatory Visit | Attending: Obstetrics & Gynecology | Admitting: Obstetrics & Gynecology

## 2014-09-03 ENCOUNTER — Encounter (HOSPITAL_COMMUNITY): Payer: Self-pay | Admitting: *Deleted

## 2014-09-03 DIAGNOSIS — Z87891 Personal history of nicotine dependence: Secondary | ICD-10-CM | POA: Diagnosis not present

## 2014-09-03 DIAGNOSIS — Z3A40 40 weeks gestation of pregnancy: Secondary | ICD-10-CM | POA: Diagnosis present

## 2014-09-03 DIAGNOSIS — F1290 Cannabis use, unspecified, uncomplicated: Secondary | ICD-10-CM | POA: Diagnosis present

## 2014-09-03 DIAGNOSIS — O99323 Drug use complicating pregnancy, third trimester: Secondary | ICD-10-CM | POA: Diagnosis present

## 2014-09-03 DIAGNOSIS — O99824 Streptococcus B carrier state complicating childbirth: Secondary | ICD-10-CM | POA: Diagnosis present

## 2014-09-03 LAB — CBC
HCT: 32.3 % — ABNORMAL LOW (ref 36.0–46.0)
HEMOGLOBIN: 11.2 g/dL — AB (ref 12.0–15.0)
MCH: 35 pg — ABNORMAL HIGH (ref 26.0–34.0)
MCHC: 34.7 g/dL (ref 30.0–36.0)
MCV: 100.9 fL — ABNORMAL HIGH (ref 78.0–100.0)
Platelets: 285 10*3/uL (ref 150–400)
RBC: 3.2 MIL/uL — ABNORMAL LOW (ref 3.87–5.11)
RDW: 13.1 % (ref 11.5–15.5)
WBC: 22 10*3/uL — AB (ref 4.0–10.5)

## 2014-09-03 MED ORDER — SENNOSIDES-DOCUSATE SODIUM 8.6-50 MG PO TABS
2.0000 | ORAL_TABLET | ORAL | Status: DC
  Administered 2014-09-04 – 2014-09-05 (×2): 2 via ORAL
  Filled 2014-09-03 (×2): qty 2

## 2014-09-03 MED ORDER — LANOLIN HYDROUS EX OINT: TOPICAL_OINTMENT | CUTANEOUS | Status: DC | PRN

## 2014-09-03 MED ORDER — ZOLPIDEM TARTRATE 5 MG PO TABS
5.0000 mg | ORAL_TABLET | Freq: Every evening | ORAL | Status: DC | PRN
Start: 2014-09-03 — End: 2014-09-05

## 2014-09-03 MED ORDER — DIBUCAINE 1 % RE OINT: 1.0000 "application " | TOPICAL_OINTMENT | RECTAL | Status: DC | PRN

## 2014-09-03 MED ORDER — OXYTOCIN 10 UNIT/ML IJ SOLN
10.0000 [IU] | Freq: Once | INTRAMUSCULAR | Status: AC
  Administered 2014-09-03: 10 [IU] via INTRAMUSCULAR

## 2014-09-03 MED ORDER — SIMETHICONE 80 MG PO CHEW
80.0000 mg | CHEWABLE_TABLET | ORAL | Status: DC | PRN
Start: 2014-09-03 — End: 2014-09-05

## 2014-09-03 MED ORDER — IBUPROFEN 600 MG PO TABS
600.0000 mg | ORAL_TABLET | Freq: Four times a day (QID) | ORAL | Status: DC
  Administered 2014-09-03 – 2014-09-05 (×9): 600 mg via ORAL
  Filled 2014-09-03 (×9): qty 1

## 2014-09-03 MED ORDER — PRENATAL MULTIVITAMIN CH
1.0000 | ORAL_TABLET | Freq: Every day | ORAL | Status: DC
  Administered 2014-09-03 – 2014-09-04 (×2): 1 via ORAL
  Filled 2014-09-03 (×2): qty 1

## 2014-09-03 MED ORDER — BENZOCAINE-MENTHOL 20-0.5 % EX AERO
1.0000 "application " | INHALATION_SPRAY | CUTANEOUS | Status: DC | PRN
  Filled 2014-09-03: qty 56

## 2014-09-03 MED ORDER — ONDANSETRON HCL 4 MG/2ML IJ SOLN: 4.0000 mg | INTRAMUSCULAR | Status: DC | PRN

## 2014-09-03 MED ORDER — DIPHENHYDRAMINE HCL 25 MG PO CAPS: 25.0000 mg | ORAL_CAPSULE | Freq: Four times a day (QID) | ORAL | Status: DC | PRN

## 2014-09-03 MED ORDER — ONDANSETRON HCL 4 MG PO TABS: 4.0000 mg | ORAL_TABLET | ORAL | Status: DC | PRN

## 2014-09-03 MED ORDER — TETANUS-DIPHTH-ACELL PERTUSSIS 5-2.5-18.5 LF-MCG/0.5 IM SUSP
0.5000 mL | Freq: Once | INTRAMUSCULAR | Status: DC
  Filled 2014-09-03: qty 0.5

## 2014-09-03 MED ORDER — WITCH HAZEL-GLYCERIN EX PADS: 1.0000 "application " | MEDICATED_PAD | CUTANEOUS | Status: DC | PRN

## 2014-09-03 MED ORDER — OXYTOCIN 10 UNIT/ML IJ SOLN
INTRAMUSCULAR | Status: AC
  Filled 2014-09-03: qty 1

## 2014-09-03 NOTE — Lactation Note (Signed)
This note was copied from the chart of Kristy Gracelyn [name]. Lactation Consultation Note  Patient Name: Kristy Gay ZOXWR'UToday's Date: 09/03/2014 Reason for consult: Initial assessment  Assisted with latching baby in cross cradle hold.  Basic breast feeding taught.  With assistance baby latched and fed rhythmically.  Brochure given to Mom.  Instructed on IP and OP lactation services available.  Encouraged skin to skin and feeding baby often on cue.  Encouraged her to call prn.  Follow up in am.  Consult Status Consult Status: Follow-up Date: 09/04/14 Follow-up type: In-patient    Judee ClaraSmith, Jamille Yoshino E 09/03/2014, 2:27 PM

## 2014-09-03 NOTE — H&P (Signed)
Kristy Gay is a 28 y.o. G3P0020 at 3865w0d who receives prenatal care at [facility] presented to the MAU in active labor with head crowning. SROM outside the hospital as she arrived.  [facility] 8834[facility]. Prenatal course not significant except GBS positive.  History OB History    Gravida Para Term Preterm AB TAB SAB Ectopic Multiple Living   3    2  1         Past Medical History  Diagnosis Date  . Medical history non-contributory    Past Surgical History  Procedure Laterality Date  . No past surgeries     Family History: family history is not on file. Social History:  reports that she has quit smoking. Her smoking use included Cigarettes. She smoked 0.50 packs per day. She has never used smokeless tobacco. 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 She reports that she does not drink alcohol.   Prenatal Transfer Tool  Maternal Diabetes: No Genetic Screening: Too late Maternal Ultrasounds/Referrals: Normal Fetal Ultrasounds or other Referrals:  None Maternal Substance Abuse:  Yes:  Type: Marijuana Significant Maternal Medications:  None Significant Maternal Lab Results:  Lab values include: Group B Strep positive Other Comments:  None  Review of Systems  Constitutional: Negative for fever.  Gastrointestinal: Positive for abdominal pain (contractions).      Last menstrual period 11/27/2013. Exam Physical Exam  Vitals reviewed. Constitutional: She is oriented to person, place, and time. She appears well-developed and well-nourished.  HENT:  Head: Normocephalic and atraumatic.  Respiratory: Effort normal.  GI:  Gravid   Musculoskeletal: Normal range of motion.  Neurological: She is alert and oriented to person, place, and time.  Skin: Skin is warm and dry.    Prenatal labs: ABO, Rh: O/POS/-- (07/28 1619) Antibody: NEG (07/28 1619) Rubella: 3.27 (07/28 1619) RPR: NON REAC (09/15 1336)  HBsAg: NEGATIVE (07/28 1619)  HIV:  NONREACTIVE (09/15 1336)  GBS: Positive (10/29 0000)   Assessment/Plan: Kristy Gay is a 28 y.o. G3P0020 at 1065w0d who delivered a viable female in the MAU.  #Normal spontaneous vaginal delivery: admit to postpartum #GBS positive: did not receive adequate prophylaxis #Feeding: breastfeeding #Contraception: Nexplanon #Circumcision: girl   Jacquelin Hawkingettey, Ralph 09/03/2014, 12:50 AM  OB fellow attestation:  I have seen and examined this patient; I agree with above documentation in the resident's note.   Kristy Gay is a 28 y.o. [phone] reporting loss of fluid here in active labor  PE: BP 124/78 mmHg  Pulse 81  Temp(Src) 98.1 F (36.7 C) (Oral)  Resp 18  SpO2 100%  LMP 11/27/2013  Breastfeeding? Unknown Gen: calm comfortable, NAD Resp: normal effort, no distress Abd: gravid  ROS, labs, PMH reviewed  Plan: - anticipate eminent delivery, unfortunately no time for antibiotics for GBS+  Perry MountACOSTA,Chaniya Genter ROCIO, MD 7:36 AM

## 2014-09-03 NOTE — Plan of Care (Signed)
Problem: Phase I Progression Outcomes Goal: OOB as tolerated unless otherwise ordered Outcome: Completed/Met Date Met:  09/03/14 Goal: Initial discharge plan identified Outcome: Completed/Met Date Met:  09/03/14

## 2014-09-03 NOTE — MAU Note (Signed)
Pt water broke after getting out of car after traveling from [place] with Family. Placed in bed and stated she felt like sh had to have a BM. Placed on EFM and cervical check =10/100/+2. Notified Dr. Loreta AveAcosta.

## 2014-09-03 NOTE — Plan of Care (Signed)
Problem: Phase I Progression Outcomes Goal: Pain controlled with appropriate interventions Outcome: Completed/Met Date Met:  09/03/14 Goal: Voiding adequately Outcome: Completed/Met Date Met:  09/03/14 Goal: VS, stable, temp < 100.4 degrees F Outcome: Completed/Met Date Met:  09/03/14 Goal: Other Phase I Outcomes/Goals Outcome: Completed/Met Date Met:  09/03/14  Problem: Phase II Progression Outcomes Goal: Rh isoimmunization per orders Outcome: Not Applicable Date Met:  76/81/15 Goal: Tolerating diet Outcome: Completed/Met Date Met:  09/03/14

## 2014-09-04 NOTE — Plan of Care (Signed)
Problem: Phase II Progression Outcomes Goal: Pain controlled on oral analgesia Outcome: Completed/Met Date Met:  09/04/14 Goal: Progress activity as tolerated unless otherwise ordered Outcome: Completed/Met Date Met:  09/04/14 Goal: Afebrile, VS remain stable Outcome: Completed/Met Date Met:  09/04/14 Goal: Other Phase II Outcomes/Goals Outcome: Completed/Met Date Met:  09/04/14  Problem: Discharge Progression Outcomes Goal: Activity appropriate for discharge plan Outcome: Completed/Met Date Met:  09/04/14 Goal: Tolerating diet Outcome: Completed/Met Date Met:  09/04/14

## 2014-09-04 NOTE — Progress Notes (Signed)
Post Partum Day 1 Subjective: no complaints and up ad lib. Pain at site of stiches, however controlled with ibuprofen  Objective: Pulse 129, temperature 98.5 F (36.9 C), temperature source Axillary, resp. rate 52, weight 2.9 kg (6 lb 6.3 oz).  Physical Exam:  General: alert and cooperative Lochia: appropriate Uterine Fundus: firm Incision: n/a DVT Evaluation: No evidence of DVT seen on physical exam.  No results for input(s): HGB, HCT in the last 72 hours.  Assessment/Plan: Plan for discharge tomorrow. Breastfeeding. Nexplanon for contraception.   LOS: 1 day   Jacquelin Hawkingettey, Jabriel Vanduyne 09/04/2014, 3:36 AM

## 2014-09-04 NOTE — Lactation Note (Signed)
This note was copied from the chart of Kristy Gracelyn NurseStephanie Prowell. Lactation Consultation Note Follow up visit at 45 hours of age.  Mom reports good feedings and denies pain with latch.  Baby has had 10 feedings with 6 voids and 3 stools.  Mom has questions about engorgement and pumping, discussed with mom.  Mom knows to call for assist as needed.    Patient Name: Kristy Gay ZOXWR'UToday's Date: 09/04/2014 Reason for consult: Follow-up assessment   Maternal Data    Feeding Feeding Type: Breast Fed  LATCH Score/Interventions Latch:  (instructed mom to call with next latch)              Intervention(s): Breastfeeding basics reviewed     Lactation Tools Discussed/Used     Consult Status Consult Status: Follow-up Date: 09/05/14 Follow-up type: In-patient    Beverely RisenShoptaw, Arvella MerlesJana Lynn 09/04/2014, 10:19 PM

## 2014-09-04 NOTE — Plan of Care (Signed)
Problem: Consults Goal: Postpartum Patient Education (See Patient Education module for education specifics.)  Outcome: Progressing  Problem: Discharge Progression Outcomes Goal: Barriers To Progression Addressed/Resolved Outcome: Not Applicable Date Met:  79/89/21 Goal: Complications resolved/controlled Outcome: Not Applicable Date Met:  19/41/74 Goal: Pain controlled with appropriate interventions Outcome: Completed/Met Date Met:  09/04/14 Goal: Remove staples per MD order Outcome: Not Applicable Date Met:  05/21/47 Goal: Discharge plan in place and appropriate Outcome: Completed/Met Date Met:  09/04/14 Goal: Other Discharge Outcomes/Goals Outcome: Completed/Met Date Met:  09/04/14

## 2014-09-04 NOTE — Plan of Care (Signed)
Problem: Discharge Progression Outcomes Goal: Pain controlled with appropriate interventions Outcome: Progressing     

## 2014-09-05 NOTE — Discharge Summary (Signed)
Obstetric Discharge Summary Reason for Admission: onset of labor Prenatal Procedures: none Intrapartum Procedures: spontaneous vaginal delivery Postpartum Procedures: none Complications-Operative and Postpartum: First degree perineal laceration and GBS pos. HEMOGLOBIN  Date Value Ref Range Status  09/03/2014 11.2* 12.0 - 15.0 g/dL Final   HCT  Date Value Ref Range Status  09/03/2014 32.3* 36.0 - 46.0 % Final   Ms. Kristy Gay is a 28 yo, G3P1021, who delivered at 3871w0d via uncomplicated spontaneous vaginal delivery.  Physical Exam:  General: alert, cooperative, appears stated age and no distress Lochia: appropriate Uterine Fundus: firm Incision: Vaginal delivery, first degree tear DVT Evaluation: No evidence of DVT seen on physical exam. Negative Homan's sign.  Discharge Diagnoses: Term Pregnancy-delivered  Discharge Information: Date: 09/05/2014 Activity: pelvic rest Diet: routine Medications: Ibuprofen Condition: stable Instructions: refer to practice specific booklet Discharge to: home   Newborn Data: Live born female  Birth Weight: 6 lb 7.9 oz (2945 g) APGAR: 9, 9  Home with mother.  [place] Gay, Kristy Merlino D 09/05/2014, 7:48 AM

## 2014-09-05 NOTE — Lactation Note (Signed)
This note was copied from the chart of Kristy Gracelyn NurseStephanie Lakins. Lactation Consultation Note: Follow up visit with mom before DC. Mom reports that baby has ben sleepy through the night- only feeding for 5-7 min. Baby last fed at 6 am. Offered assist with latch. Mom agreeable. Unwrapped and undressed baby and she latched well with swallows noted. Reviewed nursing skin to skin to keep baby awake and nursing longer. Baby nursed for 20 min on the first breast then 5 min on the second then off to sleep. No further questions at present. To call prn  Patient Name: Kristy Gay WGNFA'OToday's Date: 09/05/2014 Reason for consult: Follow-up assessment   Maternal Data Formula Feeding for Exclusion: No Has patient been taught Hand Expression?: Yes Does the patient have breastfeeding experience prior to this delivery?: No  Feeding Feeding Type: Breast Fed Length of feed: 25 min  LATCH Score/Interventions Latch: Grasps breast easily, tongue down, lips flanged, rhythmical sucking.  Audible Swallowing: A few with stimulation  Type of Nipple: Everted at rest and after stimulation  Comfort (Breast/Nipple): Soft / non-tender     Hold (Positioning): Assistance needed to correctly position infant at breast and maintain latch. Intervention(s): Breastfeeding basics reviewed;Position options  LATCH Score: 8  Lactation Tools Discussed/Used     Consult Status Consult Status: Complete    Pamelia HoitWeeks, Kaysan Peixoto D 09/05/2014, 9:19 AM

## 2014-09-05 NOTE — Discharge Instructions (Signed)

## 2014-09-05 NOTE — Progress Notes (Signed)
UR chart review completed.  

## 2014-10-04 IMAGING — US US MISC SOFT TISSUE
1 series · 7 of 7 positions shown · non-contrast
Comparison: None.

CLINICAL DATA: 2 cm nodule on abdomen. Left-sided palpable
abnormality.

EXAM:
LIMITED ULTRASOUND OF ABDOMINAL SOFT TISSUES
TECHNIQUE: Ultrasound examination of the abdominal wall soft tissues was
performed in the area of clinical concern.

[Series 1: us misc soft tissue · 7 of 7 slices shown]
[im 1/7]
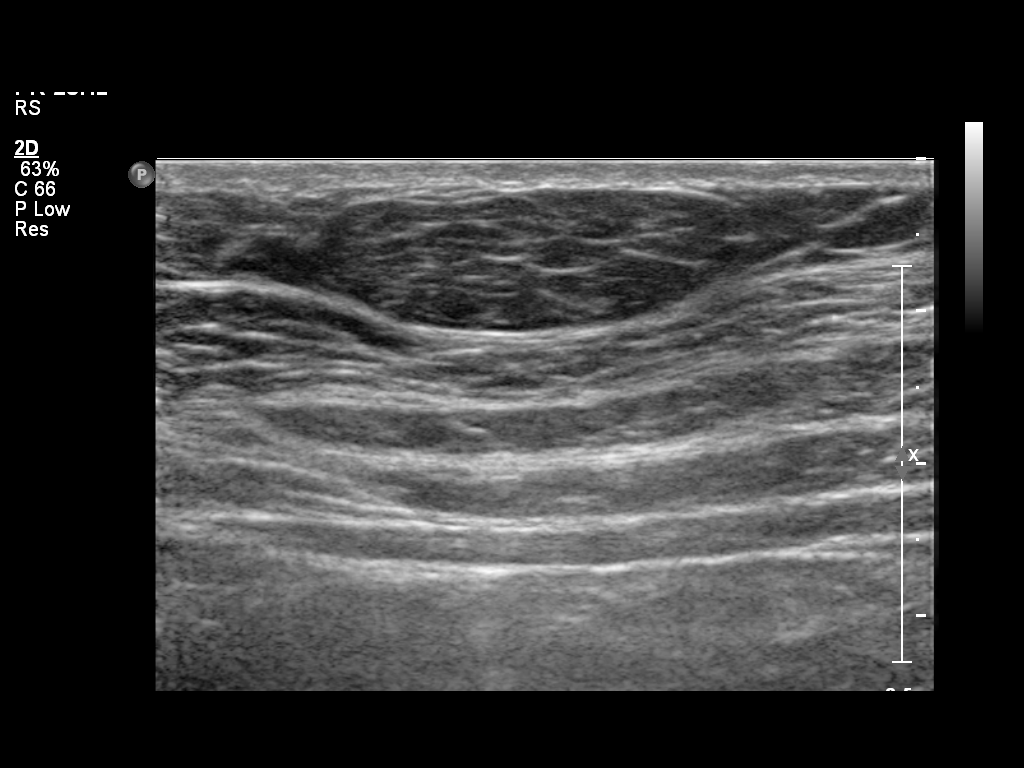
[im 2/7]
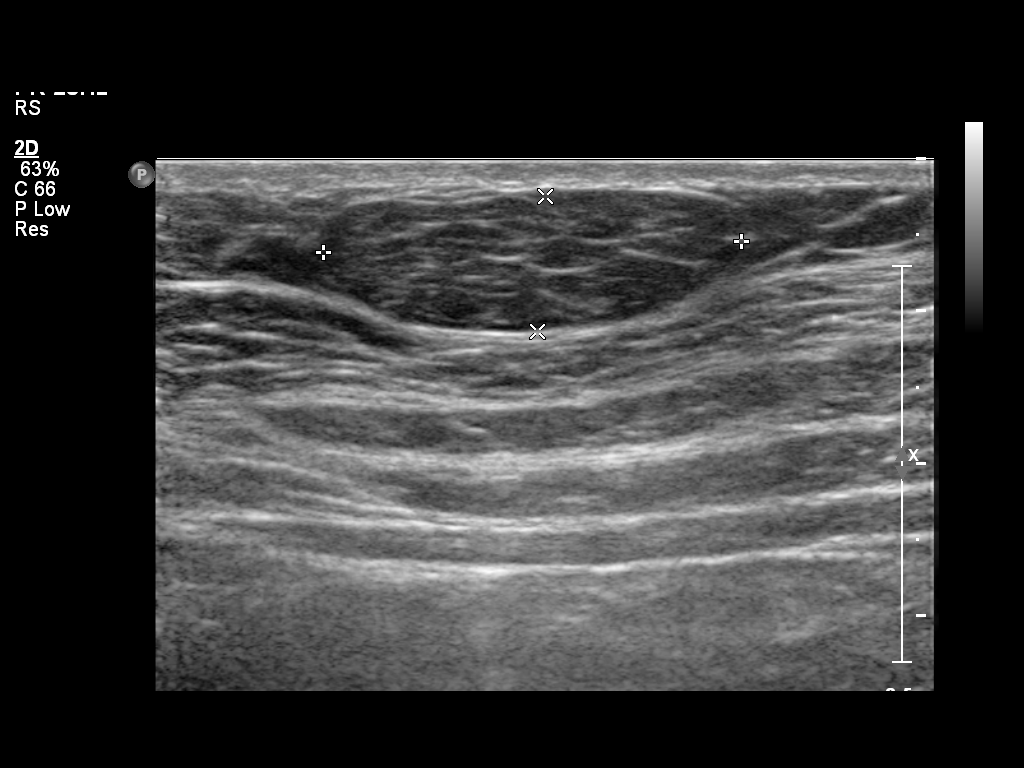
[im 3/7]
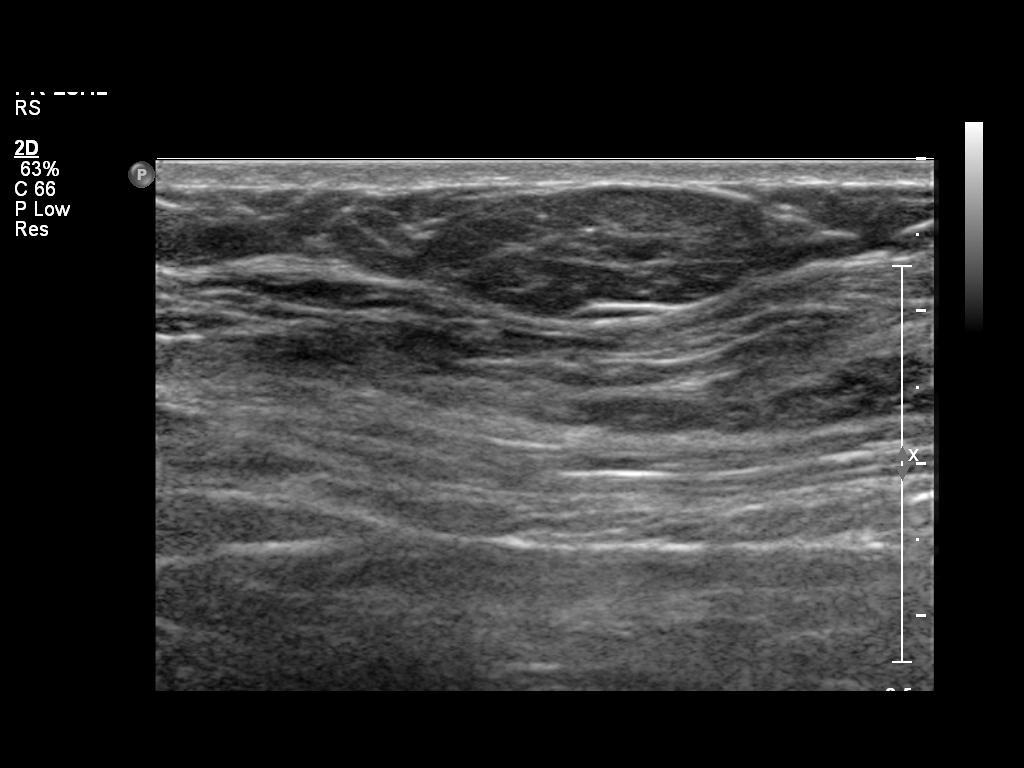
[im 4/7]
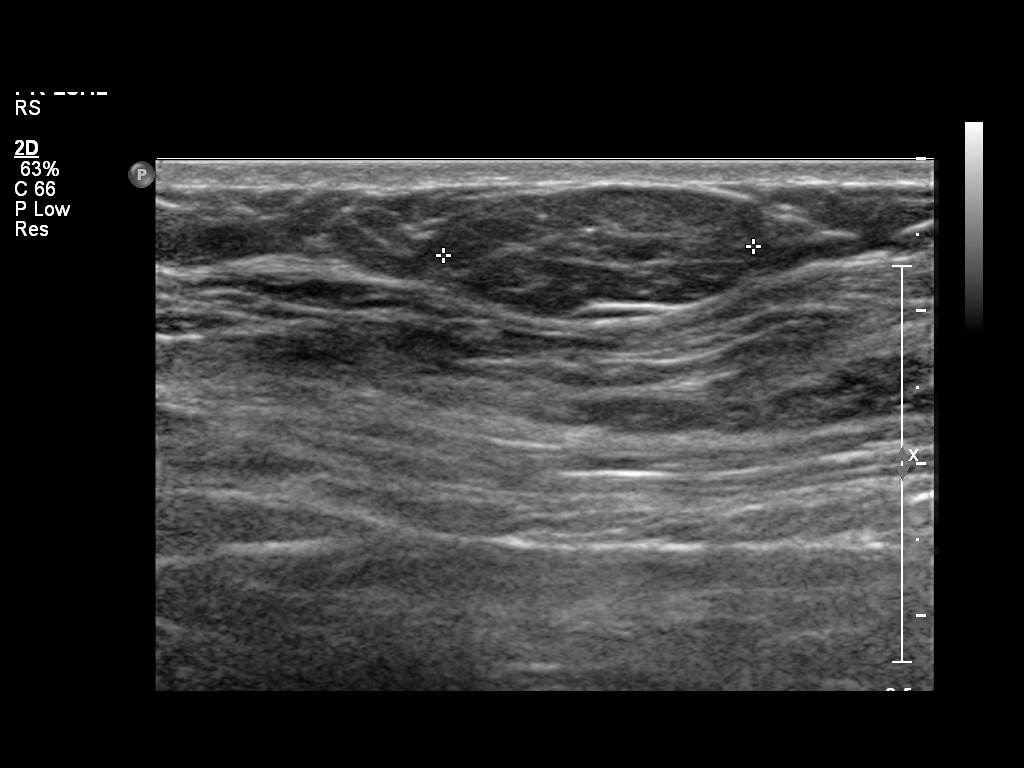
[im 5/7]
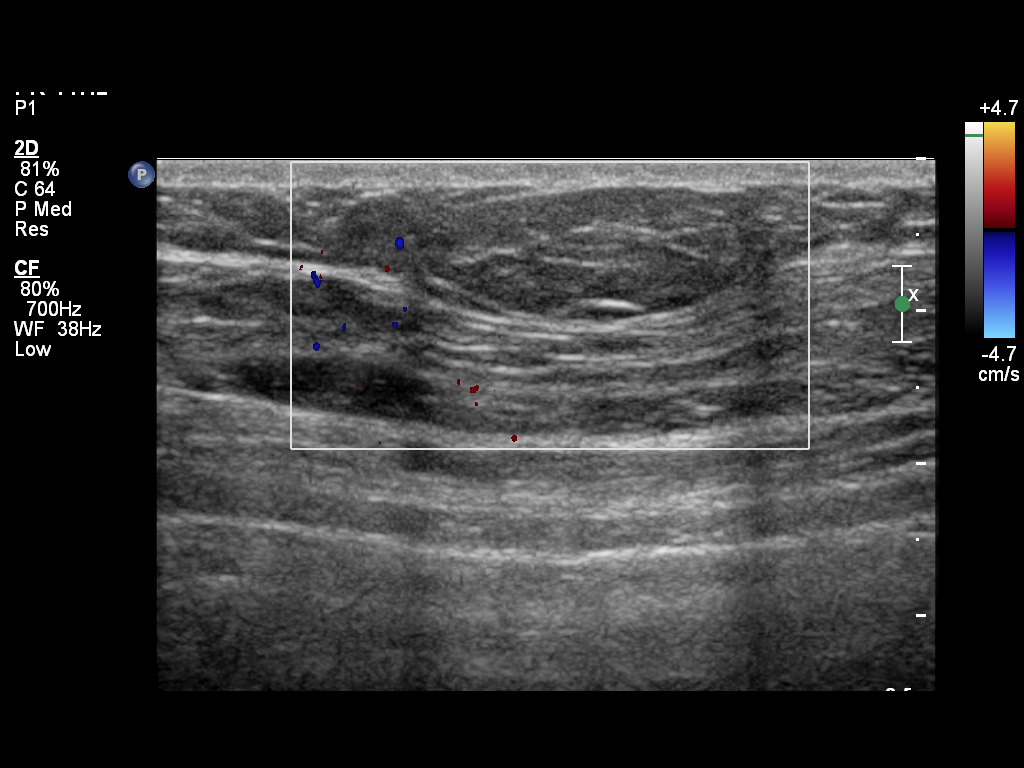
[im 6/7]
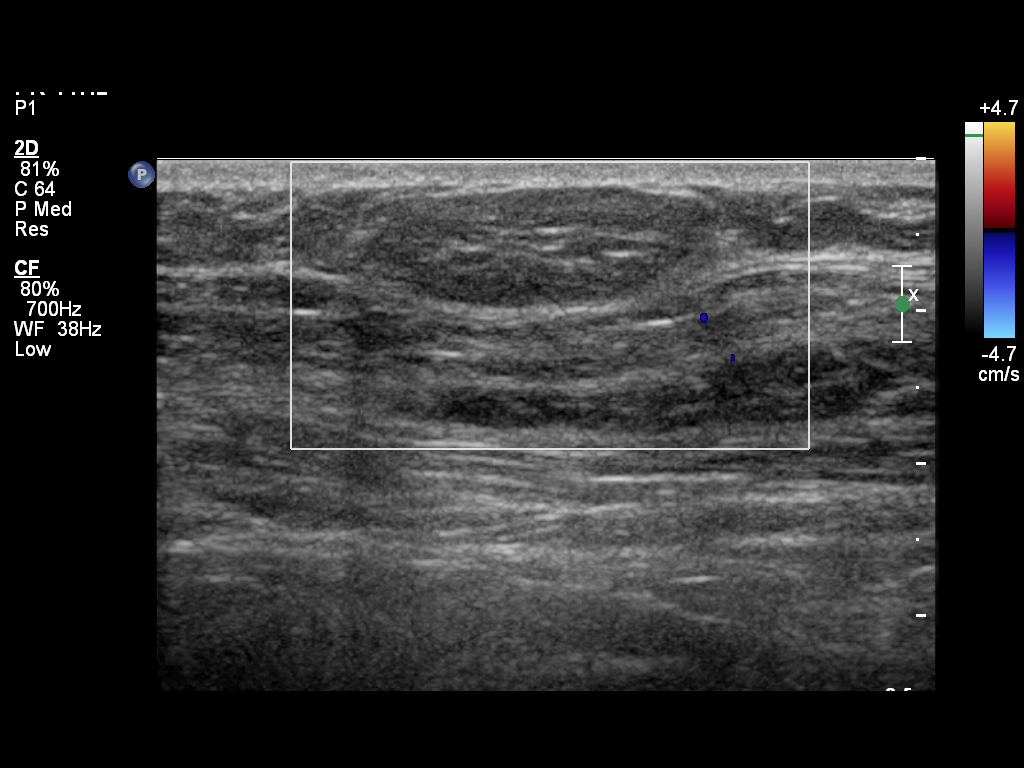
[im 7/7]
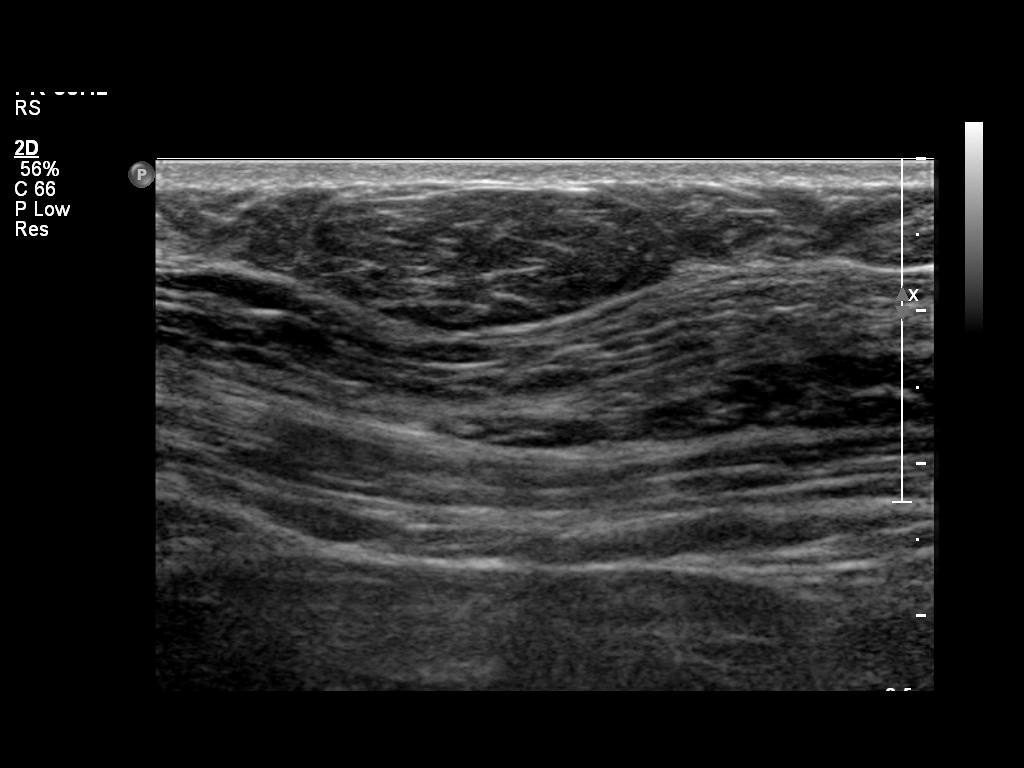

[7 of 7 positions shown; findings below may reference images not displayed]

FINDINGS: The palpable abnormality corresponds to a mildly hypoechoic (to
muscle) oval shaped lesion which measures 2.7 x 0.9 x 2.0 cm.
Relatively hypovascular to avascular. Low-level internal echoes.
IMPRESSION: Nonspecific subcutaneous lesion within the left abdominal wall.
Favor lipoma.

## 2014-10-04 IMAGING — US US OB FOLLOW-UP
2 series · 12 of 28 positions shown · non-contrast
Comparison: none

[Series 1: us ob follow up · 3 of 24 slices shown (1 of 2)]
[im 4/24]
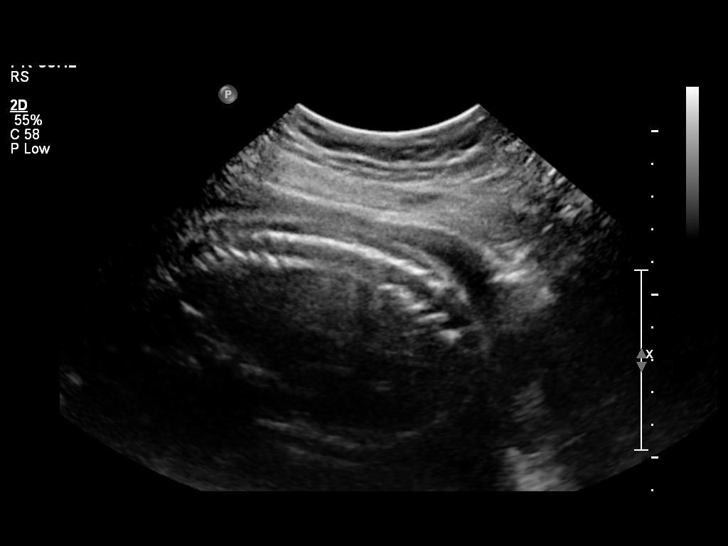
[im 10/24]
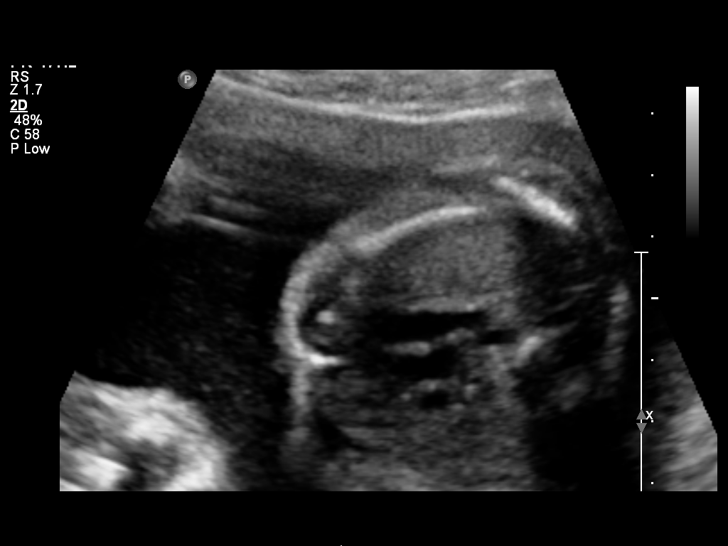
[im 17/24]
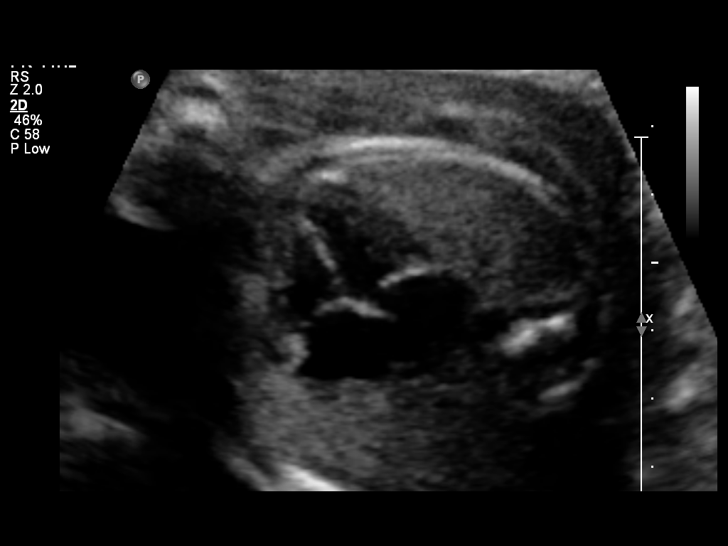

[Series 1: us ob follow up · 63 acquisitions, 9 frames shown (2 of 2)]
[im 1/63]
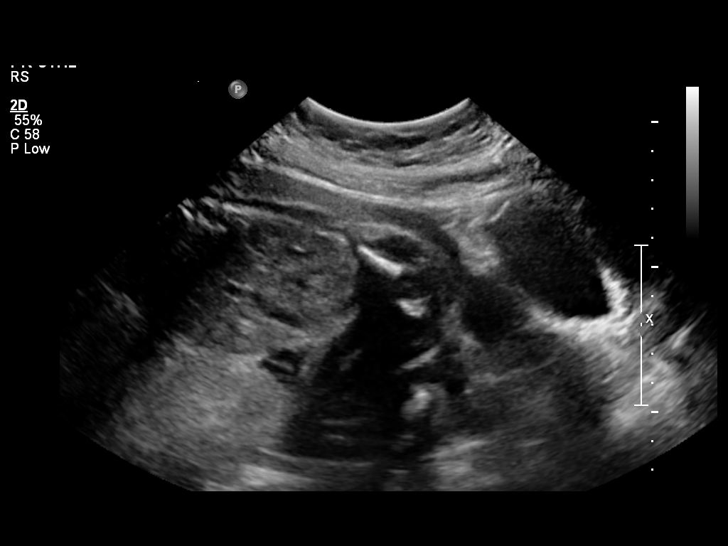
[im 7/63]
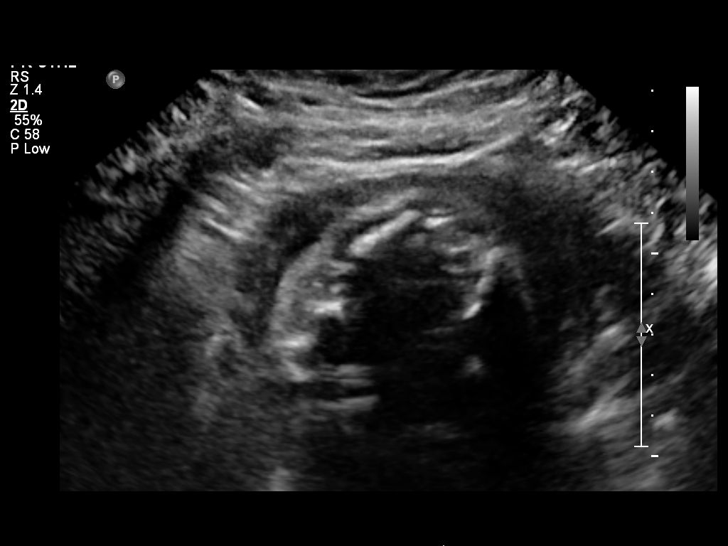
[im 14/63]
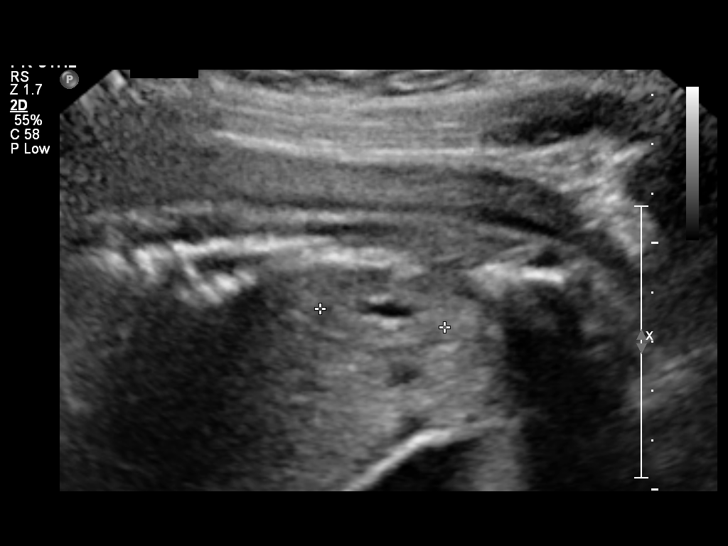
[im 23/63]
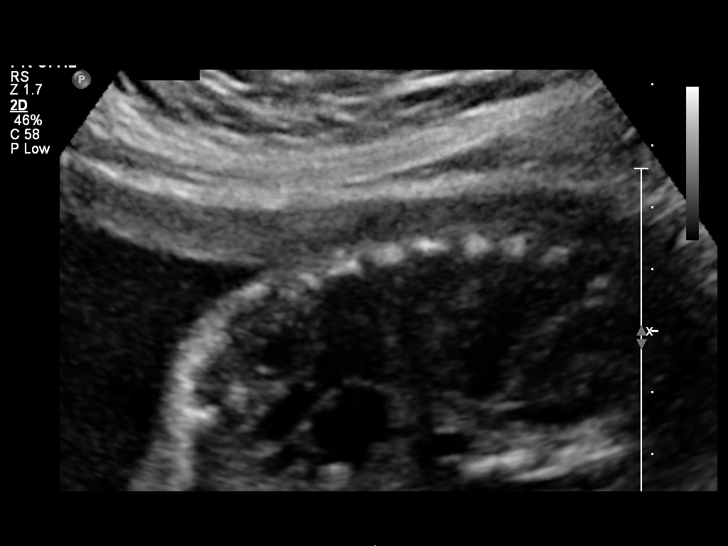
[im 30/63]
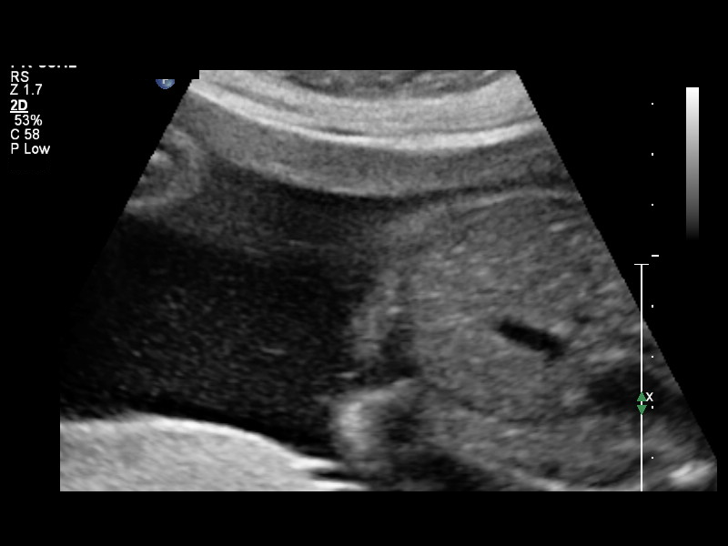
[im 36/63]
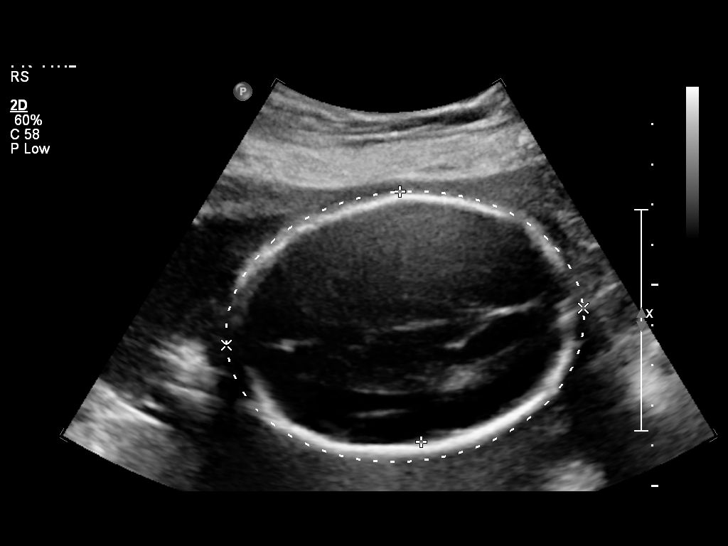
[im 46/63]
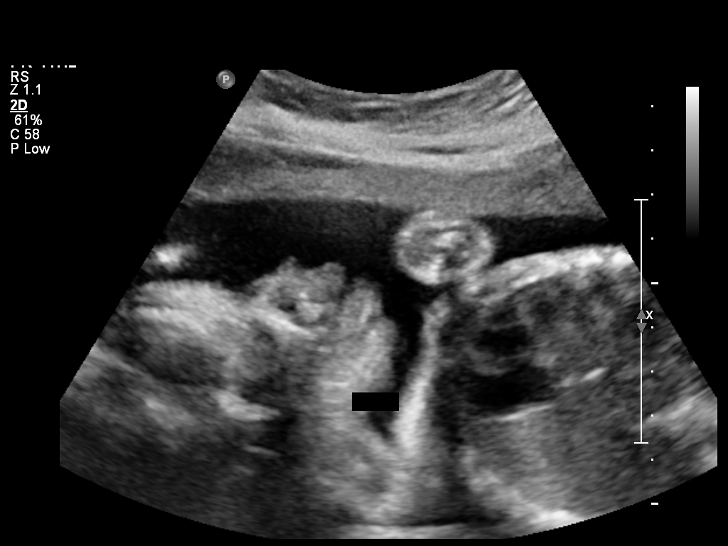
[im 53/63]
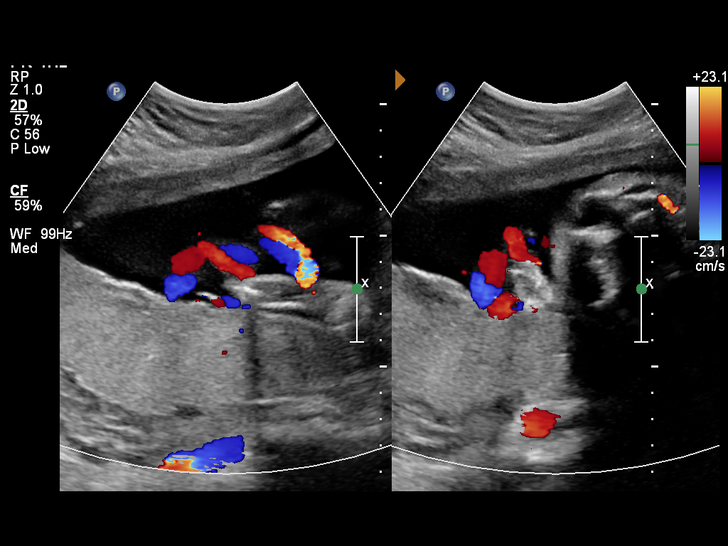
[im 59/63]
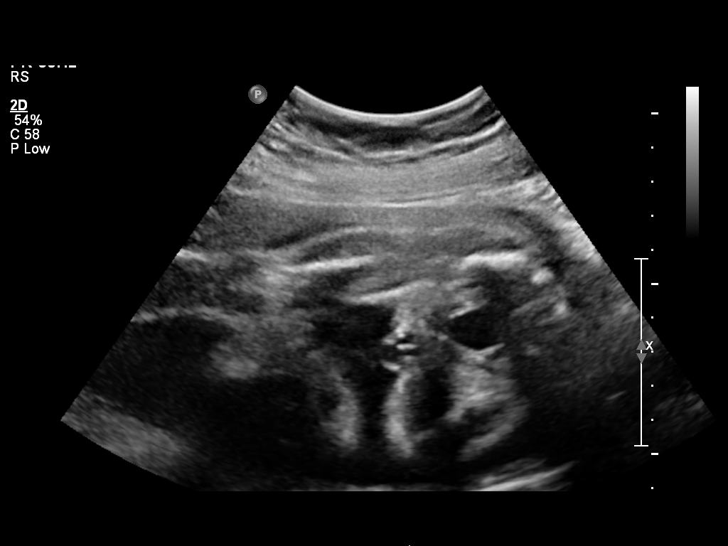

[12 of 28 positions shown; findings below may reference images not displayed]

OBSTETRICS REPORT
                      (Signed Final 06/03/2014 [DATE])

Service(s) Provided

 US OB FOLLOW UP                                       76816.1
Indications

 drug abuse (marijuna use)
 Follow-up incomplete fetal anatomic evaluation
Fetal Evaluation

 Num Of Fetuses:    1
 Fetal Heart Rate:  147                          bpm
 Cardiac Activity:  Observed
 Presentation:      Frank breech
 Placenta:          Posterior, above cervical
                    os
 P. Cord            Visualized
 Insertion:

 Amniotic Fluid
 AFI FV:      Subjectively within normal limits
Biometry

 BPD:     62.5  mm     G. Age:  25w 2d                CI:        66.18   70 - 86
                                                      FL/HC:      19.6   18.6 -

 HC:     246.4  mm     G. Age:  26w 5d       25  %    HC/AC:      1.14   1.05 -

 AC:     216.4  mm     G. Age:  26w 1d       24  %    FL/BPD:     77.3   71 - 87
 FL:      48.3  mm     G. Age:  26w 2d       22  %    FL/AC:      22.3   20 - 24
 CER:     30.2  mm     G. Age:  26w 4d       47  %

 Est. FW:     900  gm           2 lb     41  %
Gestational Age

 LMP:           26w 5d        Date:  11/27/13                 EDD:   09/03/14
 U/S Today:     26w 1d                                        EDD:   09/07/14
 Best:          26w 5d     Det. By:  LMP  (11/27/13)          EDD:   09/03/14
Anatomy

 Cranium:          Appears normal         Aortic Arch:      Appears normal
 Fetal Cavum:      Appears normal         Ductal Arch:      Appears normal
 Ventricles:       Appears normal         Diaphragm:        Appears normal
 Choroid Plexus:   Appears normal         Stomach:          Appears normal, left
                                                            sided
 Cerebellum:       Appears normal         Abdomen:          Appears normal
 Posterior Fossa:  Previously seen        Abdominal Wall:   Previously seen
 Nuchal Fold:      Not applicable (>20    Cord Vessels:     Appears normal (3
                   wks GA)                                  vessel cord)
 Face:             Profile appears        Kidneys:          Appear normal
                   normal
 Lips:             Appears normal         Bladder:          Appears normal
 Heart:            Appears normal         Spine:            Appears normal
                   (4CH, axis, and
                   situs)
 RVOT:             Not well visualized    Lower             Previously seen
                                          Extremities:
 LVOT:             Appears normal         Upper             Previously seen
                                          Extremities:

 Other:  Female gender.. Heels prev. visualized. Technically difficult due to
         maternal habitus and fetal position.
Targeted Anatomy

 Fetal Central Nervous System
 Cisterna Magna:
Cervix Uterus Adnexa

 Cervical Length:    3.2      cm

 Cervix:       Normal appearance by transabdominal scan.
 Left Ovary:    Not visualized.
 Right Ovary:   Not visualized.
Impression

 SIUP at 26+5 weeks
 Normal interval anatomy; anatomic survey complete except
 for RVOT
 Normal amniotic fluid volume
 Appropriate interval growth with EFW at the 41st %tile
Recommendations

 Follow-up as clinically indicated

 questions or concerns.

## 2014-10-10 ENCOUNTER — Other Ambulatory Visit (HOSPITAL_COMMUNITY)
Admission: RE | Admit: 2014-10-10 | Discharge: 2014-10-10 | Disposition: A | Discharge status: Home / Self Care | Payer: Medicaid Other | Source: Ambulatory Visit | Attending: Medical | Admitting: Medical

## 2014-10-10 ENCOUNTER — Encounter: Payer: Self-pay | Admitting: Medical

## 2014-10-10 ENCOUNTER — Ambulatory Visit (INDEPENDENT_AMBULATORY_CARE_PROVIDER_SITE_OTHER): Payer: Medicaid Other | Admitting: Medical

## 2014-10-10 DIAGNOSIS — Z1151 Encounter for screening for human papillomavirus (HPV): Secondary | ICD-10-CM | POA: Insufficient documentation

## 2014-10-10 DIAGNOSIS — Z01411 Encounter for gynecological examination (general) (routine) with abnormal findings: Secondary | ICD-10-CM | POA: Insufficient documentation

## 2014-10-10 DIAGNOSIS — Z3049 Encounter for surveillance of other contraceptives: Secondary | ICD-10-CM

## 2014-10-10 LAB — POCT PREGNANCY, URINE: Preg Test, Ur: NEGATIVE

## 2014-10-10 MED ORDER — ETONOGESTREL 68 MG ~~LOC~~ IMPL
68.0000 mg | DRUG_IMPLANT | Freq: Once | SUBCUTANEOUS | Status: AC
  Administered 2014-10-10: 68 mg via SUBCUTANEOUS

## 2014-10-10 NOTE — Progress Notes (Signed)
Patient ID: Kristy Gay, female   DOB: 28-Sep-1986, 29 y.o.   MRN: [id number] Subjective:     Kristy Gay is a 29 y.o. female who presents for a postpartum visit. She is 10 weeks postpartum following a spontaneous vaginal delivery. I have fully reviewed the prenatal and intrapartum course. The delivery was at 40.0 gestational weeks. Outcome: spontaneous vaginal delivery. Anesthesia: none. Postpartum course has been normal. Baby's course has been normal. Baby is feeding by breast. Bleeding no bleeding. Bowel function is normal. Bladder function is normal. Patient is not sexually active. Contraception method is none. Postpartum depression screening: negative.  The following portions of the patient's history were reviewed and updated as appropriate: allergies, current medications, past family history, past medical history, past social history, past surgical history and problem list.  Review of Systems Pertinent items are noted in HPI.   Objective:    There were no vitals taken for this visit.  General:  alert and cooperative   Breasts:  not performed  Lungs: clear to auscultation bilaterally  Heart:  regular rate and rhythm, S1, S2 normal, no murmur, click, rub or gallop  Abdomen: soft, non-tender; bowel sounds normal; no masses,  no organomegaly   Vulva:  normal  Vagina: normal vagina, no discharge, exudate, lesion, or erythema and pap smear obtained  Cervix:  normal contour without lesions. scant bleeding following pap smear  Corpus: normal size, contour, position, consistency, mobility, non-tender  Adnexa:  no mass, fullness, tenderness  Rectal Exam: Not performed.         GYNECOLOGY CLINIC PROCEDURE NOTE  Nexplanon Insertion Procedure Patient was given informed consent, she signed consent form.  Patient does understand that irregular bleeding is a very common side effect of this medication. She was advised to have backup contraception for one week after placement. Pregnancy test  in clinic today was negative.  Appropriate time out taken.  Patient's left arm was prepped and draped in the usual sterile fashion.. The ruler used to measure and mark insertion area.  Patient was prepped with alcohol swab and then injected with 3 ml of 1% lidocaine.  She was prepped with betadine, Nexplanon removed from packaging,  Device confirmed in needle, then inserted full length of needle and withdrawn per handbook instructions. Nexplanon was able to palpated in the patient's arm; patient palpated the insert herself. There was minimal blood loss.  Patient insertion site covered with guaze and a pressure bandage to reduce any bruising.  The patient tolerated the procedure well and was given post procedure instructions.   Assessment:     Normal postpartum exam. Pap smear done at today's visit. Last pap smear on 05/03/14 had absent transformation zone and repeat was not performed during pregnancy.   Plan:    1. Contraception: Nexplanon. Condoms advised as back-up method x 1 week after insertion.  2. Patient will be contacted with any abnormal pap results 3. Follow up in: 1 year for annual exam or as needed.

## 2014-10-10 NOTE — Patient Instructions (Signed)
Etonogestrel implant What is this medicine? ETONOGESTREL (et oh noe JES trel) is a contraceptive (birth control) device. It is used to prevent pregnancy. It can be used for up to 3 years. This medicine may be used for other purposes; ask your health care provider or pharmacist if you have questions. COMMON BRAND NAME(S): Implanon, Nexplanon What should I tell my health care provider before I take this medicine? They need to know if you have any of these conditions: -abnormal vaginal bleeding -blood vessel disease or blood clots -cancer of the breast, cervix, or liver -depression -diabetes -gallbladder disease -headaches -heart disease or recent heart attack -high blood pressure -high cholesterol -kidney disease -liver disease -renal disease -seizures -tobacco smoker -an unusual or allergic reaction to etonogestrel, other hormones, anesthetics or antiseptics, medicines, foods, dyes, or preservatives -pregnant or trying to get pregnant -breast-feeding How should I use this medicine? This device is inserted just under the skin on the inner side of your upper arm by a health care professional. Talk to your pediatrician regarding the use of this medicine in children. Special care may be needed. Overdosage: If you think you've taken too much of this medicine contact a poison control center or emergency room at once. Overdosage: If you think you have taken too much of this medicine contact a poison control center or emergency room at once. NOTE: This medicine is only for you. Do not share this medicine with others. What if I miss a dose? This does not apply. What may interact with this medicine? Do not take this medicine with any of the following medications: -amprenavir -bosentan -fosamprenavir This medicine may also interact with the following medications: -barbiturate medicines for inducing sleep or treating seizures -certain medicines for fungal infections like ketoconazole and  itraconazole -griseofulvin -medicines to treat seizures like carbamazepine, felbamate, oxcarbazepine, phenytoin, topiramate -modafinil -phenylbutazone -rifampin -some medicines to treat HIV infection like atazanavir, indinavir, lopinavir, nelfinavir, tipranavir, ritonavir -St. John's wort This list may not describe all possible interactions. Give your health care provider a list of all the medicines, herbs, non-prescription drugs, or dietary supplements you use. Also tell them if you smoke, drink alcohol, or use illegal drugs. Some items may interact with your medicine. What should I watch for while using this medicine? This product does not protect you against HIV infection (AIDS) or other sexually transmitted diseases. You should be able to feel the implant by pressing your fingertips over the skin where it was inserted. Tell your doctor if you cannot feel the implant. What side effects may I notice from receiving this medicine? Side effects that you should report to your doctor or health care professional as soon as possible: -allergic reactions like skin rash, itching or hives, swelling of the face, lips, or tongue -breast lumps -changes in vision -confusion, trouble speaking or understanding -dark urine -depressed mood -general ill feeling or flu-like symptoms -light-colored stools -loss of appetite, nausea -right upper belly pain -severe headaches -severe pain, swelling, or tenderness in the abdomen -shortness of breath, chest pain, swelling in a leg -signs of pregnancy -sudden numbness or weakness of the face, arm or leg -trouble walking, dizziness, loss of balance or coordination -unusual vaginal bleeding, discharge -unusually weak or tired -yellowing of the eyes or skin Side effects that usually do not require medical attention (Report these to your doctor or health care professional if they continue or are bothersome.): -acne -breast pain -changes in  weight -cough -fever or chills -headache -irregular menstrual bleeding -itching, burning, and   vaginal discharge -pain or difficulty passing urine -sore throat This list may not describe all possible side effects. Call your doctor for medical advice about side effects. You may report side effects to FDA at 1-800-FDA-1088. Where should I keep my medicine? This drug is given in a hospital or clinic and will not be stored at home. NOTE: This sheet is a summary. It may not cover all possible information. If you have questions about this medicine, talk to your doctor, pharmacist, or health care provider.  2015, [employer]. (2012-03-30 15:37:45)  

## 2014-10-11 LAB — CYTOLOGY - PAP

## 2014-10-14 ENCOUNTER — Encounter: Payer: Self-pay | Admitting: *Deleted

## 2017-10-28 ENCOUNTER — Ambulatory Visit (INDEPENDENT_AMBULATORY_CARE_PROVIDER_SITE_OTHER): Payer: BLUE CROSS/BLUE SHIELD

## 2017-10-28 VITALS — BP 129/92 | HR 86 | Wt 277.0 lb

## 2017-10-28 DIAGNOSIS — Z3009 Encounter for other general counseling and advice on contraception: Secondary | ICD-10-CM

## 2017-10-28 DIAGNOSIS — Z3046 Encounter for surveillance of implantable subdermal contraceptive: Secondary | ICD-10-CM

## 2017-10-28 MED ORDER — NORGESTIMATE-ETH ESTRADIOL 0.25-35 MG-MCG PO TABS: 1.0000 | ORAL_TABLET | Freq: Every day | ORAL | 11 refills | Status: DC

## 2017-10-28 NOTE — Progress Notes (Signed)
     GYNECOLOGY OFFICE PROCEDURE NOTE  Kristy SavannahStephanie L Gay is a 32 y.o. [phone] here for Nexplanon removal.  Last pap smear was on 10/10/2014 and was normal.  No other gynecologic concerns.  Nexplanon Removal Patient identified, informed consent performed, consent signed.   Appropriate time out taken. Nexplanon site identified.  Area prepped in usual sterile fashon. One ml of 1% lidocaine was used to anesthetize the area at the distal end of the implant. A small stab incision was made right beside the implant on the distal portion.  The Nexplanon rod was grasped using hemostats and removed without difficulty.  There was minimal blood loss. There were no complications.  3 ml of 1% lidocaine was injected around the incision for post-procedure analgesia.  Steri-strips were applied over the small incision.  A pressure bandage was applied to reduce any bruising.  The patient tolerated the procedure well and was given post procedure instructions.  Patient is planning to use OCPs for contraception.  Patient due for pap smear but currently on period and desires to wait until bleeding has stopped. Will schedule appointment for next week.   1. Nexplanon removal   2. Encounter for other general counseling and advice on contraception    -Return in 1 week for pap smear -RX for OCP sent to patient pharmacy  Kristy Gay, CNM  10/28/17 10:44 AM

## 2017-10-28 NOTE — Patient Instructions (Signed)
Oral Contraception Information Oral contraceptive pills (OCPs) are medicines taken to prevent pregnancy. OCPs work by preventing the ovaries from releasing eggs. The hormones in OCPs also cause the cervical mucus to thicken, preventing the sperm from entering the uterus. The hormones also cause the uterine lining to become thin, not allowing a fertilized egg to attach to the inside of the uterus. OCPs are highly effective when taken exactly as prescribed. However, OCPs do not prevent sexually transmitted diseases (STDs). Safe sex practices, such as using condoms along with the pill, can help prevent STDs. Before taking the pill, you may have a physical exam and Pap test. Your health care provider may order blood tests. The health care provider will make sure you are a good candidate for oral contraception. Discuss with your health care provider the possible side effects of the OCP you may be prescribed. When starting an OCP, it can take 2 to 3 months for the body to adjust to the changes in hormone levels in your body. Types of oral contraception  The combination pill-This pill contains estrogen and progestin (synthetic progesterone) hormones. The combination pill comes in 21-day, 28-day, or 91-day packs. Some types of combination pills are meant to be taken continuously (365-day pills). With 21-day packs, you do not take pills for 7 days after the last pill. With 28-day packs, the pill is taken every day. The last 7 pills are without hormones. Certain types of pills have more than 21 hormone-containing pills. With 91-day packs, the first 84 pills contain both hormones, and the last 7 pills contain no hormones or contain estrogen only.  The minipill-This pill contains the progesterone hormone only. The pill is taken every day continuously. It is very important to take the pill at the same time each day. The minipill comes in packs of 28 pills. All 28 pills contain the hormone. Advantages of oral  contraceptive pills  Decreases premenstrual symptoms.  Treats menstrual period cramps.  Regulates the menstrual cycle.  Decreases a heavy menstrual flow.  May treatacne, depending on the type of pill.  Treats abnormal uterine bleeding.  Treats polycystic ovarian syndrome.  Treats endometriosis.  Can be used as emergency contraception. Things that can make oral contraceptive pills less effective OCPs can be less effective if:  You forget to take the pill at the same time every day.  You have a stomach or intestinal disease that lessens the absorption of the pill.  You take OCPs with other medicines that make OCPs less effective, such as antibiotics, certain HIV medicines, and some seizure medicines.  You take expired OCPs.  You forget to restart the pill on day 7, when using the packs of 21 pills.  Risks associated with oral contraceptive pills Oral contraceptive pills can sometimes cause side effects, such as:  Headache.  Nausea.  Breast tenderness.  Irregular bleeding or spotting.  Combination pills are also associated with a small increased risk of:  Blood clots.  Heart attack.  Stroke.  This information is not intended to replace advice given to you by your health care provider. Make sure you discuss any questions you have with your health care provider. Document Released: 12/14/2002 Document Revised: 02/29/2016 Document Reviewed: 03/14/2013 Elsevier Interactive Patient Education  2018 [employer]  

## 2017-10-29 ENCOUNTER — Telehealth: Payer: Self-pay | Admitting: General Practice

## 2017-10-29 MED ORDER — NORGESTIMATE-ETH ESTRADIOL 0.25-35 MG-MCG PO TABS: 1.0000 | ORAL_TABLET | Freq: Every day | ORAL | 11 refills | Status: DC

## 2017-10-29 NOTE — Telephone Encounter (Signed)
Patient called and left message on nurse line stating her OCP Rx was sent to the wrong pharmacy, it should be [facility] on [address]. Med reordered to correct pharmacy. Called patient, no answer- left message stating we are trying to reach you to return your phone call. We did get your voicemail message and have sent your Rx to your pharmacy. You may call us back if you have questions

## 2017-11-04 ENCOUNTER — Encounter: Payer: Self-pay | Admitting: *Deleted

## 2017-11-21 ENCOUNTER — Ambulatory Visit: Payer: BLUE CROSS/BLUE SHIELD | Admitting: Student

## 2018-01-14 ENCOUNTER — Ambulatory Visit: Payer: BLUE CROSS/BLUE SHIELD

## 2018-01-14 ENCOUNTER — Encounter: Payer: Self-pay | Admitting: *Deleted

## 2018-01-14 NOTE — Progress Notes (Signed)
Kristy Gay did not keep her scheduled appointment for follow up. Per discussion with [name],CNM does not need to be called, may reschedule if she calls.

## 2018-06-09 ENCOUNTER — Ambulatory Visit (HOSPITAL_COMMUNITY)
Admission: EM | Admit: 2018-06-09 | Discharge: 2018-06-09 | Disposition: A | Discharge status: Home / Self Care | Payer: BLUE CROSS/BLUE SHIELD | Attending: Family Medicine | Admitting: Family Medicine

## 2018-06-09 ENCOUNTER — Encounter (HOSPITAL_COMMUNITY): Payer: Self-pay | Admitting: Emergency Medicine

## 2018-06-09 DIAGNOSIS — J029 Acute pharyngitis, unspecified: Secondary | ICD-10-CM | POA: Diagnosis not present

## 2018-06-09 DIAGNOSIS — J36 Peritonsillar abscess: Secondary | ICD-10-CM

## 2018-06-09 DIAGNOSIS — H9201 Otalgia, right ear: Secondary | ICD-10-CM

## 2018-06-09 MED ORDER — PENICILLIN V POTASSIUM 500 MG PO TABS: 500.0000 mg | ORAL_TABLET | Freq: Two times a day (BID) | ORAL | 0 refills | Status: AC

## 2018-06-09 MED ORDER — ACETAMINOPHEN 325 MG PO TABS
650.0000 mg | ORAL_TABLET | Freq: Once | ORAL | Status: AC
  Administered 2018-06-09: 650 mg via ORAL

## 2018-06-09 MED ORDER — ACETAMINOPHEN 325 MG PO TABS
ORAL_TABLET | ORAL | Status: AC
  Filled 2018-06-09: qty 2

## 2018-06-09 MED ORDER — CEFTRIAXONE SODIUM 250 MG IJ SOLR
250.0000 mg | Freq: Once | INTRAMUSCULAR | Status: AC
  Administered 2018-06-09: 250 mg via INTRAMUSCULAR

## 2018-06-09 MED ORDER — METHYLPREDNISOLONE 4 MG PO TBPK: ORAL_TABLET | ORAL | 0 refills | Status: DC

## 2018-06-09 MED ORDER — DEXAMETHASONE SODIUM PHOSPHATE 10 MG/ML IJ SOLN
INTRAMUSCULAR | Status: AC
  Filled 2018-06-09: qty 1

## 2018-06-09 MED ORDER — LIDOCAINE HCL (PF) 1 % IJ SOLN
INTRAMUSCULAR | Status: AC
  Filled 2018-06-09: qty 30

## 2018-06-09 MED ORDER — DEXAMETHASONE SODIUM PHOSPHATE 10 MG/ML IJ SOLN
10.0000 mg | Freq: Once | INTRAMUSCULAR | Status: AC
  Administered 2018-06-09: 10 mg via INTRAVENOUS

## 2018-06-09 MED ORDER — CEFTRIAXONE SODIUM 250 MG IJ SOLR
INTRAMUSCULAR | Status: AC
  Filled 2018-06-09: qty 250

## 2018-06-09 NOTE — ED Triage Notes (Signed)
PT reports sore throat, right sided facial swelling, right ear pain for 2 days. Temp 102.8

## 2018-06-09 NOTE — ED Provider Notes (Signed)
[facility]    CSN: [id number] Arrival date & time: 06/09/18  1814     History   Chief Complaint Chief Complaint  Patient presents with  . Sore Throat  . Fever  . Otalgia    HPI Kristy Gay is a 32 y.o. female.   HPI  She has a history of high fever, sore throat, and ear pain on the right.  Her throat pain is much more painful on the right than the left.  She has a history of peritonsillar abscess.  She is worried that she has another.  Runny or stuffy nose.  No cough.  She is barely able to swallow her own saliva.  She can swallow some sips of water.  She was able to take a Tylenol when she arrived.  Past Medical History:  Diagnosis Date  . Medical history non-contributory     Patient Active Problem List   Diagnosis Date Noted  . Marijuana abuse 05/03/2014  . Mild obesity 05/03/2014    Past Surgical History:  Procedure Laterality Date  . NO PAST SURGERIES      OB History    Gravida  3   Para  1   Term  1   Preterm      AB  2   Living  1     SAB  1   TAB      Ectopic      Multiple  0   Live Births  1            Home Medications    Prior to Admission medications   Medication Sig Start Date End Date Taking? Authorizing Provider  methylPREDNISolone (MEDROL DOSEPAK) 4 MG TBPK tablet tad 06/09/18   Eustace Moore, MD  norgestimate-ethinyl estradiol (ORTHO-CYCLEN,SPRINTEC,PREVIFEM) 0.25-35 MG-MCG tablet Take 1 tablet by mouth daily. 10/29/17   Rolm Bookbinder, CNM  penicillin v potassium (VEETID) 500 MG tablet Take 1 tablet (500 mg total) by mouth 2 (two) times daily for 10 days. 06/09/18 06/19/18  Eustace Moore, MD    Family History No family history on file. Patient denies significant family history.  Other with hypertension Social History Social History   Tobacco Use  . Smoking status: Former Smoker    Packs/day: 0.50    Types: Cigarettes    Last attempt to quit: 04/06/2014    Years since quitting: 4.1  .  Smokeless tobacco: Never Used  Substance Use Topics  . Alcohol use: No  . Drug use: Yes    Frequency: 2.0 times per week    Types: Marijuana    Comment: Stopped 04/05/14     Allergies   Patient has no known allergies.   Review of Systems Review of Systems  Constitutional: Positive for appetite change, fever and unexpected weight change. Negative for chills.  HENT: Positive for ear pain, sore throat and trouble swallowing.   Eyes: Negative for pain and visual disturbance.  Respiratory: Negative for cough and shortness of breath.   Cardiovascular: Negative for chest pain and palpitations.  Gastrointestinal: Negative for abdominal pain and vomiting.  Genitourinary: Negative for dysuria and hematuria.  Musculoskeletal: Negative for arthralgias and back pain.  Skin: Negative for color change and rash.  Neurological: Positive for headaches. Negative for seizures and syncope.  All other systems reviewed and are negative.    Physical Exam Triage Vital Signs ED Triage Vitals  Enc Vitals Group     BP 06/09/18 1913 116/75  Pulse Rate 06/09/18 1913 (!) 117     Resp 06/09/18 1913 16     Temp 06/09/18 1913 (!) 102.8 F (39.3 C)     Temp Source 06/09/18 1913 Oral     SpO2 06/09/18 1913 100 %     Weight 06/09/18 1914 270 lb (122.5 kg)     Height --      Head Circumference --      Peak Flow --      Pain Score 06/09/18 1913 10     Pain Loc --      Pain Edu? --      Excl. in GC? --    No data found.  Updated Vital Signs BP 111/70 (BP Location: Left Arm)   Pulse (!) 107   Temp (!) 100.5 F (38.1 C) (Oral)   Resp 18   Wt 122.5 kg   LMP 05/12/2018   SpO2 99%   BMI 42.29 kg/m        Physical Exam  Constitutional: She appears well-developed and well-nourished. She appears ill. She appears distressed.  Ill.  Very tired.  HENT:  Head: Normocephalic and atraumatic.  Right Ear: Hearing and tympanic membrane normal.  Left Ear: Hearing and tympanic membrane normal.    Mouth/Throat: Oropharyngeal exudate, posterior oropharyngeal edema and posterior oropharyngeal erythema present. Tonsils are 4+ on the right. Tonsils are 3+ on the left. Tonsillar exudate.  Right tonsil swollen to the midline.  Adjacent soft palate is erythematous and swollen.  Eyes: Pupils are equal, round, and reactive to light. Conjunctivae are normal.  Neck: Normal range of motion.  Cardiovascular: Normal rate, regular rhythm and normal heart sounds.  Pulmonary/Chest: Effort normal and breath sounds normal. No respiratory distress.  Abdominal: Soft. She exhibits no distension. There is no tenderness.  Musculoskeletal: Normal range of motion. She exhibits no edema.  Neurological: She is alert.  Skin: Skin is warm and dry.  Psychiatric: She has a normal mood and affect. Her behavior is normal.     UC Treatments / Results  Labs (all labs ordered are listed, but only abnormal results are displayed) Labs Reviewed - No data to display  EKG None  Radiology No results found.  Procedures Procedures (including critical care time)  Medications Ordered in UC Medications  acetaminophen (TYLENOL) tablet 650 mg (650 mg Oral Given 06/09/18 1921)  cefTRIAXone (ROCEPHIN) injection 250 mg (250 mg Intramuscular Given 06/09/18 2032)  dexamethasone (DECADRON) injection 10 mg (10 mg Intravenous Given 06/09/18 2032)    Initial Impression / Assessment and Plan / UC Course  I have reviewed the triage vital signs and the nursing notes.  Pertinent labs & imaging results that were available during my care of the patient were reviewed by me and considered in my medical decision making (see chart for details).     I called the ENT on-call.  They recommend IV Decadron and a shot of an antibiotic.  Tomorrow start Dosepak and oral antibiotics.  She is to call the ENT office.  ER if worse.  Discussed with patient. Final Clinical Impressions(s) / UC Diagnoses   Final diagnoses:  Peritonsillar abscess      Discharge Instructions     We are giving you a dose of prednisone and a dose of antibiotic here tonight Go home and make sure you can keep down some sips of fluids not to get dehydrated Tomorrow morning start the Medrol Dosepak and the oral penicillin Call the ear nose and throat office at 830 to  have a same-day appointment their number is [phone] If you get worse instead of better over the night, especially if you have difficulty swallowing or breathing, go to the ER   ED Prescriptions    Medication Sig Dispense Auth. Provider   penicillin v potassium (VEETID) 500 MG tablet Take 1 tablet (500 mg total) by mouth 2 (two) times daily for 10 days. 20 tablet Eustace Moore, MD   methylPREDNISolone (MEDROL DOSEPAK) 4 MG TBPK tablet tad 21 tablet Eustace Moore, MD     Controlled Substance Prescriptions [place] Controlled Substance Registry consulted? Not Applicable   Eustace Moore, MD 06/09/18 2204

## 2018-06-09 NOTE — Discharge Instructions (Addendum)
We are giving you a dose of prednisone and a dose of antibiotic here tonight Go home and make sure you can keep down some sips of fluids not to get dehydrated Tomorrow morning start the Medrol Dosepak and the oral penicillin Call the ear nose and throat office at 830 to have a same-day appointment their number is [phone] If you get worse instead of better over the night, especially if you have difficulty swallowing or breathing, go to the ER

## 2018-06-09 NOTE — ED Notes (Signed)
PT ADDS THAT SHE HAS HAD AN ABSCESS DRAINED BEFORE OVER RIGHT SIDE OF FACE / THROAT

## 2018-06-10 ENCOUNTER — Telehealth (HOSPITAL_COMMUNITY): Payer: Self-pay | Admitting: Emergency Medicine

## 2018-06-10 LAB — POCT RAPID STREP A: STREPTOCOCCUS, GROUP A SCREEN (DIRECT): POSITIVE — AB

## 2018-06-10 NOTE — Telephone Encounter (Signed)
Pt called asking if she could return to work today sts she feels better; sts she did not follow up with ENT; pt sts she had antibiotics filled but the pharmacy didn't have pred

## 2018-08-25 ENCOUNTER — Other Ambulatory Visit: Payer: Self-pay

## 2023-02-04 ENCOUNTER — Other Ambulatory Visit: Payer: Self-pay

## 2023-02-04 ENCOUNTER — Encounter (HOSPITAL_BASED_OUTPATIENT_CLINIC_OR_DEPARTMENT_OTHER): Payer: Self-pay

## 2023-02-04 ENCOUNTER — Emergency Department (HOSPITAL_BASED_OUTPATIENT_CLINIC_OR_DEPARTMENT_OTHER): Payer: Self-pay

## 2023-02-04 ENCOUNTER — Emergency Department (HOSPITAL_BASED_OUTPATIENT_CLINIC_OR_DEPARTMENT_OTHER)
Admission: EM | Admit: 2023-02-04 | Discharge: 2023-02-04 | Disposition: A | Discharge status: Home / Self Care | Payer: Self-pay | Attending: Emergency Medicine | Admitting: Emergency Medicine

## 2023-02-04 DIAGNOSIS — Z87891 Personal history of nicotine dependence: Secondary | ICD-10-CM | POA: Insufficient documentation

## 2023-02-04 DIAGNOSIS — Z1152 Encounter for screening for COVID-19: Secondary | ICD-10-CM | POA: Insufficient documentation

## 2023-02-04 DIAGNOSIS — J020 Streptococcal pharyngitis: Secondary | ICD-10-CM | POA: Insufficient documentation

## 2023-02-04 LAB — RESP PANEL BY RT-PCR (RSV, FLU A&B, COVID)  RVPGX2
Influenza A by PCR: NEGATIVE
Influenza B by PCR: NEGATIVE
Resp Syncytial Virus by PCR: NEGATIVE
SARS Coronavirus 2 by RT PCR: NEGATIVE

## 2023-02-04 LAB — GROUP A STREP BY PCR: Group A Strep by PCR: DETECTED — AB

## 2023-02-04 MED ORDER — PENICILLIN G BENZATHINE 1200000 UNIT/2ML IM SUSY
1.2000 10*6.[IU] | PREFILLED_SYRINGE | Freq: Once | INTRAMUSCULAR | Status: AC
  Administered 2023-02-04: 1.2 10*6.[IU] via INTRAMUSCULAR
  Filled 2023-02-04: qty 2

## 2023-02-04 MED ORDER — ACETAMINOPHEN 325 MG PO TABS
650.0000 mg | ORAL_TABLET | Freq: Once | ORAL | Status: AC | PRN
  Administered 2023-02-04: 650 mg via ORAL
  Filled 2023-02-04: qty 2

## 2023-02-04 NOTE — ED Provider Notes (Signed)
[facility] Provider Note: Kristy Dell, MD, FACEP  CSN: [id number] MRN: [id number] ARRIVAL: 02/04/23 at 0014 ROOM: [facility]02/[facility]02   CHIEF COMPLAINT  Sore Throat   HISTORY OF PRESENT ILLNESS  02/04/23 3:04 AM Kristy Gay is a 37 y.o. female with a history of strep throat.  She is here with 2 days of sore throat.  She rates the pain as a 9 out of 10, worse with swallowing.  She has had a fever with this that her temperature on arrival was 101.4.  She was given Tylenol with improvement.   History reviewed. No pertinent past medical history.   Past Surgical History:  Procedure Laterality Date   NO PAST SURGERIES      History reviewed. No pertinent family history.  Social History   Tobacco Use   Smoking status: Former    Packs/day: .5    Types: Cigarettes    Quit date: 04/06/2014    Years since quitting: 8.8   Smokeless tobacco: Never  Substance Use Topics   Alcohol use: No   Drug use: Yes    Frequency: 2.0 times per week    Types: Marijuana    Comment: Stopped 04/05/14    Prior to Admission medications   Not on File    Allergies Patient has no known allergies.   REVIEW OF SYSTEMS  Negative except as noted here or in the History of Present Illness.   PHYSICAL EXAMINATION  Initial Vital Signs Blood pressure (!) 133/92, pulse (!) 132, temperature (!) 101.4 F (38.6 C), temperature source Oral, resp. rate 18, height 5' 6.5" (1.689 m), weight 108.9 kg, last menstrual period 01/20/2023, SpO2 98 %, currently breastfeeding.  Examination General: Well-developed, well-nourished female in no acute distress; appearance consistent with age of record HENT: normocephalic; atraumatic; tonsillar erythema and exudate; uvula midline Eyes: Normal appearance Neck: supple; anterior cervical lymphadenopathy Heart: regular rate and rhythm Lungs: clear to auscultation bilaterally Abdomen: soft; nondistended; nontender; bowel sounds present Extremities: No  deformity; full range of motion Neurologic: Awake, alert and oriented; motor function intact in all extremities and symmetric; no facial droop Skin: Warm and dry Psychiatric: Normal mood and affect   RESULTS  Summary of this visit's results, reviewed and interpreted by myself:   EKG Interpretation  Date/Time:    Ventricular Rate:    PR Interval:    QRS Duration:   QT Interval:    QTC Calculation:   R Axis:     Text Interpretation:         Laboratory Studies: Results for orders placed or performed during the hospital encounter of 02/04/23 (from the past 24 hour(s))  Group A Strep by PCR     Status: Abnormal   Collection Time: 02/04/23 12:41 AM   Specimen: Throat; Sterile Swab  Result Value Ref Range   Group A Strep by PCR DETECTED (A) NOT DETECTED  Resp panel by RT-PCR (RSV, Flu A&B, Covid) Throat     Status: None   Collection Time: 02/04/23 12:41 AM   Specimen: Throat; Nasal Swab  Result Value Ref Range   SARS Coronavirus 2 by RT PCR NEGATIVE NEGATIVE   Influenza A by PCR NEGATIVE NEGATIVE   Influenza B by PCR NEGATIVE NEGATIVE   Resp Syncytial Virus by PCR NEGATIVE NEGATIVE   Imaging Studies: DG Chest 2 View  Result Date: 02/04/2023 CLINICAL DATA:  Cough and fever for 2 days, initial encounter EXAM: CHEST - 2 VIEW COMPARISON:  None Available. FINDINGS: The heart size and  mediastinal contours are within normal limits. Both lungs are clear. The visualized skeletal structures are unremarkable. IMPRESSION: No active cardiopulmonary disease. Electronically Signed   By: Alcide Clever M.D.   On: 02/04/2023 00:41    ED COURSE and MDM  Nursing notes, initial and subsequent vitals signs, including pulse oximetry, reviewed and interpreted by myself.  Vitals:   02/04/23 0020 02/04/23 0020 02/04/23 0022 02/04/23 0307  BP:  (!) 133/92    Pulse:  (!) 137 (!) 132   Resp:  18    Temp:  99 F (37.2 C) (!) 101.4 F (38.6 C) 99.5 F (37.5 C)  TempSrc:   Oral Oral  SpO2:  98% 98%    Weight: 108.9 kg     Height: 5' 6.5" (1.689 m)      Medications  penicillin g benzathine (BICILLIN LA) 1200000 UNIT/2ML injection 1.2 Million Units (has no administration in time range)  acetaminophen (TYLENOL) tablet 650 mg (650 mg Oral Given 02/04/23 0028)   The patient is positive for strep in her throat and she would prefer to be treated with a single dose of Bicillin IM.   PROCEDURES  Procedures   ED DIAGNOSES     ICD-10-CM   1. Strep pharyngitis  J02.0          Neetu Carrozza, MD 02/04/23 [phone]

## 2023-02-04 NOTE — ED Triage Notes (Signed)
Pt ha shad sore throat for 1 month but last 2 days noticed ulcers and not getting any better.

## 2023-02-06 ENCOUNTER — Encounter (HOSPITAL_BASED_OUTPATIENT_CLINIC_OR_DEPARTMENT_OTHER): Payer: Self-pay

## 2023-02-06 ENCOUNTER — Emergency Department (HOSPITAL_BASED_OUTPATIENT_CLINIC_OR_DEPARTMENT_OTHER)
Admission: EM | Admit: 2023-02-06 | Discharge: 2023-02-06 | Disposition: A | Discharge status: Home / Self Care | Payer: Self-pay | Attending: Emergency Medicine | Admitting: Emergency Medicine

## 2023-02-06 DIAGNOSIS — J020 Streptococcal pharyngitis: Secondary | ICD-10-CM | POA: Insufficient documentation

## 2023-02-06 MED ORDER — DEXAMETHASONE SODIUM PHOSPHATE 10 MG/ML IJ SOLN
10.0000 mg | Freq: Once | INTRAMUSCULAR | Status: AC
  Administered 2023-02-06: 10 mg via INTRAMUSCULAR
  Filled 2023-02-06: qty 1

## 2023-02-06 NOTE — ED Triage Notes (Signed)
Was here on 4/30 and diagnosed with strep, given penicillin injection. States has been rubbing hydrogen peroxide on throat with Q-tip, has continued pain. Taking tylenol.

## 2023-02-06 NOTE — ED Provider Notes (Signed)
[facility] [facility] Provider Note   CSN: [id number] Arrival date & time: 02/06/23  0749     History  Chief Complaint  Patient presents with   Sore Throat    Kristy Gay is a 37 y.o. female.  Patient is a 37 year old female who presents with a sore throat.  She was seen here 2 days ago and diagnosed with strep throat based on a PCR.  She was given a shot of Bicillin.  She said that she still having a significantly sore throat and it feels swollen.  She is having a hard time sleeping because it feels swollen.  No shortness of breath.  She says it just hurts to swallow.  No ongoing fevers.  No vomiting or diarrhea.  No rashes.  She has been using Tylenol and is also been putting some hydrogen peroxide on the back of her throat with a Q-tip.       Home Medications Prior to Admission medications   Not on File      Allergies    Patient has no known allergies.    Review of Systems   Review of Systems  Constitutional:  Positive for fatigue. Negative for chills, diaphoresis and fever.  HENT:  Positive for sore throat and trouble swallowing. Negative for congestion, rhinorrhea and sneezing.   Eyes: Negative.   Respiratory:  Negative for cough, chest tightness and shortness of breath.   Cardiovascular:  Negative for chest pain and leg swelling.  Gastrointestinal:  Negative for abdominal pain, blood in stool, diarrhea, nausea and vomiting.  Genitourinary:  Negative for difficulty urinating, flank pain, frequency and hematuria.  Musculoskeletal:  Negative for arthralgias and back pain.  Skin:  Negative for rash.  Neurological:  Negative for dizziness, speech difficulty, weakness, numbness and headaches.    Physical Exam Updated Vital Signs BP (!) 143/92 (BP Location: Right Arm)   Pulse 100   Temp 98.3 F (36.8 C) (Oral)   Resp 18   Ht 5' 6.5" (1.689 m)   Wt 108.9 kg   LMP 01/20/2023   SpO2 99%   BMI 38.16 kg/m  Physical  Exam Constitutional:      Appearance: She is well-developed.  HENT:     Head: Normocephalic and atraumatic.     Mouth/Throat:     Mouth: Mucous membranes are moist.     Comments: Mild erythema to the posterior pharynx, no exudates, uvula is midline, no peritonsillar fullness, no trismus, positive bilateral cervical lymphadenopathy Eyes:     Pupils: Pupils are equal, round, and reactive to light.  Cardiovascular:     Rate and Rhythm: Normal rate and regular rhythm.     Heart sounds: Normal heart sounds.  Pulmonary:     Effort: Pulmonary effort is normal. No respiratory distress.     Breath sounds: Normal breath sounds. No wheezing or rales.  Chest:     Chest wall: No tenderness.  Abdominal:     General: Bowel sounds are normal.     Palpations: Abdomen is soft.     Tenderness: There is no abdominal tenderness. There is no guarding or rebound.  Musculoskeletal:        General: Normal range of motion.     Cervical back: Normal range of motion and neck supple.  Lymphadenopathy:     Cervical: No cervical adenopathy.  Skin:    General: Skin is warm and dry.     Findings: No rash.  Neurological:  Mental Status: She is alert and oriented to person, place, and time.     ED Results / Procedures / Treatments   Labs (all labs ordered are listed, but only abnormal results are displayed) Labs Reviewed - No data to display  EKG None  Radiology No results found.  Procedures Procedures    Medications Ordered in ED Medications  dexamethasone (DECADRON) injection 10 mg (has no administration in time range)    ED Course/ Medical Decision Making/ A&P                             Medical Decision Making Risk Prescription drug management.   Patient is a 37 year old who presents with sore throat.  She was recently diagnosed with strep throat and treated appropriately with Bicillin.  This was 2 days ago.  Her throat exam is benign.  Her tonsils are slightly swollen and  erythematous but there is no clinical suggestions of a peritonsillar abscess.  Overall she is well-appearing.  Will give her dose of Decadron for symptomatic improvement.  She was advised on ongoing symptomatic care.  She was advised not to use any more of the recommended amount of Tylenol and that she can alternate this with anti-inflammatory such as ibuprofen although again not to take any more than the recommended amount.  It does appear that she had been taking Tylenol more than the recommended amount but she has not taken enough over the last 2 days that would be concerning for liver damage.  She was discharged home in good condition.  Return precautions were given.  Final Clinical Impression(s) / ED Diagnoses Final diagnoses:  Strep pharyngitis    Rx / DC Orders ED Discharge Orders     None         Rolan Bucco, MD 02/06/23 [phone]

## 2024-03-30 ENCOUNTER — Other Ambulatory Visit: Payer: Self-pay

## 2024-03-30 ENCOUNTER — Encounter (HOSPITAL_BASED_OUTPATIENT_CLINIC_OR_DEPARTMENT_OTHER): Payer: Self-pay | Admitting: Emergency Medicine

## 2024-03-30 ENCOUNTER — Emergency Department (HOSPITAL_BASED_OUTPATIENT_CLINIC_OR_DEPARTMENT_OTHER)
Admission: EM | Admit: 2024-03-30 | Discharge: 2024-03-30 | Disposition: A | Discharge status: Home / Self Care | Payer: Self-pay | Attending: Emergency Medicine | Admitting: Emergency Medicine

## 2024-03-30 DIAGNOSIS — H9201 Otalgia, right ear: Secondary | ICD-10-CM | POA: Insufficient documentation

## 2024-03-30 DIAGNOSIS — J029 Acute pharyngitis, unspecified: Secondary | ICD-10-CM | POA: Insufficient documentation

## 2024-03-30 LAB — RESP PANEL BY RT-PCR (RSV, FLU A&B, COVID)  RVPGX2
Influenza A by PCR: NEGATIVE
Influenza B by PCR: NEGATIVE
Resp Syncytial Virus by PCR: NEGATIVE
SARS Coronavirus 2 by RT PCR: NEGATIVE

## 2024-03-30 LAB — GROUP A STREP BY PCR: Group A Strep by PCR: NOT DETECTED

## 2024-03-30 MED ORDER — AMOXICILLIN 500 MG PO CAPS: 500.0000 mg | ORAL_CAPSULE | Freq: Three times a day (TID) | ORAL | 0 refills | Status: AC

## 2024-03-30 MED ORDER — DEXAMETHASONE SODIUM PHOSPHATE 10 MG/ML IJ SOLN
10.0000 mg | Freq: Once | INTRAMUSCULAR | Status: AC
  Administered 2024-03-30: 10 mg via INTRAMUSCULAR
  Filled 2024-03-30: qty 1

## 2024-03-30 MED ORDER — LIDOCAINE VISCOUS HCL 2 % MT SOLN
15.0000 mL | Freq: Once | OROMUCOSAL | Status: AC
  Administered 2024-03-30: 15 mL via OROMUCOSAL
  Filled 2024-03-30: qty 15

## 2024-03-30 NOTE — ED Triage Notes (Signed)
 Pt presents to the ED via POV with complaints of sore throat x5 days. Has tried OTC throat lozenges without improvement. Rates the pain 8/10 - able to maintain secretions. A&Ox4 at this time. Denies fevers, chills, nor cough.

## 2024-03-30 NOTE — Discharge Instructions (Addendum)
 I would wait 2 to 3 days before picking up the antibiotic but I have sent it to your pharmacy if symptoms do not improve.  Would recommend alternating Tylenol  and ibuprofen .  Make sure you are staying well-hydrated.  Alternate Pedialyte and Gatorade.  Return to ER with new or worsening symptoms.

## 2024-03-30 NOTE — ED Provider Notes (Signed)
 [place] [facility] Provider Note   CSN: [id number] Arrival date & time: 03/30/24  1948     Patient presents with: Sore Throat   Kristy Gay is a 38 y.o. female.  Without significant past medical history reports to emergency room with complaint of right ear pain, sore throat and cough for 5 days.  She tried over-the-counter medications without improvement.  Rates pain 8 out of 10.  She has normal phonation and is handling secretions.  She denies any fever.  No known sick contact.    Sore Throat       Prior to Admission medications   Not on File    Allergies: Patient has no known allergies.    Review of Systems  HENT:  Positive for sore throat.     Updated Vital Signs BP (!) 130/90   Pulse 100   Temp 98 F (36.7 C) (Oral)   Resp 18   Ht 5' 6.5 (1.689 m)   Wt 99.8 kg   LMP 03/29/2024 (Exact Date)   SpO2 95%   BMI 34.98 kg/m   Physical Exam Vitals and nursing note reviewed.  Constitutional:      General: She is not in acute distress.    Appearance: She is not toxic-appearing.  HENT:     Head: Normocephalic and atraumatic.     Mouth/Throat:     Pharynx: Posterior oropharyngeal erythema present.     Comments: Uvula midline.  No exudates.  Eyes:     General: No scleral icterus.    Conjunctiva/sclera: Conjunctivae normal.    Cardiovascular:     Rate and Rhythm: Normal rate and regular rhythm.     Pulses: Normal pulses.     Heart sounds: Normal heart sounds.  Pulmonary:     Effort: Pulmonary effort is normal. No respiratory distress.     Breath sounds: Normal breath sounds.  Abdominal:     General: Abdomen is flat. Bowel sounds are normal.     Palpations: Abdomen is soft.     Tenderness: There is no abdominal tenderness.   Skin:    General: Skin is warm and dry.     Findings: No lesion.   Neurological:     General: No focal deficit present.     Mental Status: She is alert and oriented to person, place,  and time. Mental status is at baseline.     (all labs ordered are listed, but only abnormal results are displayed) Labs Reviewed  GROUP A STREP BY PCR  RESP PANEL BY RT-PCR (RSV, FLU A&B, COVID)  [id number]    EKG: None  Radiology: No results found.   Procedures   Medications Ordered in the ED - No data to display                                  Medical Decision Making Risk Prescription drug management.   Kristy Gay 38 y.o. presented today for URI like symptoms. Working DDx that I considered at this time includes, but not limited to, viral illness, pharyngitis, mono, sinusitis, electrolyte abnormality, AOM.  R/o DDx:: these additional diagnoses are not consistent with patient's history, presentation, physical exam, labs/imaging findings.  Labs:  Respiratory Panel: negative Group A Strep: negative  Problem List / ED Course / Critical interventions / Medication management  Patient presents to emergency room with symptoms consistent with viral URI.  Symptoms have been  ongoing for 5 days.  Not associated with chest pain shortness of breath or fever.  She does have cough but this is dry.  Notes a sore throat.  On my exam she has no significant tonsillar swelling.  Her uvula is midline.  She has normal phonation and is handling secretions.  Also complains of right ear pain but there is no sign of acute otitis externa or acute otitis media. Symptoms consistent with viral URI with cough.  Respiratory panel is negative.  Group A strep is negative.  Patient reports that she has had the symptoms before and had to come back for antibiotic.  I discussed that I feel these are most likely viral in nature but will send antibiotic if symptoms do not start to improve within 2 to 3 days. I ordered medication including decadron   Reevaluation of the patient after these medicines showed that the patient improved Patients vitals assessed. Upon arrival patient is  hemodynamically stable.  I  have reviewed the patients home medicines and have made adjustments as needed     Plan: F/u w/ PCP in 2-3d to ensure resolution of sx.  Patient was given return precautions. Patient stable for discharge at this time.  Patient educated on sx and dx and verbalized understanding of plan. Return to ER if new or worsening sx.       Final diagnoses:  Pharyngitis, unspecified etiology    ED Discharge Orders     None          Shermon Warren SAILOR, PA-C 03/30/24 2331    Ruthe Cornet, DO 03/31/24 1457
